# Patient Record
Sex: Female | Born: 1960 | Race: White | Hispanic: No | Marital: Single | Smoking: Former smoker
Health system: Southern US, Community
[De-identification: ages and names within clinical notes are randomized; demographics above are authoritative.]

## PROBLEM LIST (undated history)

## (undated) DIAGNOSIS — E669 Obesity, unspecified: Secondary | ICD-10-CM

## (undated) DIAGNOSIS — E785 Hyperlipidemia, unspecified: Secondary | ICD-10-CM

## (undated) DIAGNOSIS — R569 Unspecified convulsions: Secondary | ICD-10-CM

## (undated) DIAGNOSIS — G8929 Other chronic pain: Secondary | ICD-10-CM

## (undated) DIAGNOSIS — I2699 Other pulmonary embolism without acute cor pulmonale: Secondary | ICD-10-CM

## (undated) DIAGNOSIS — F32A Depression, unspecified: Secondary | ICD-10-CM

## (undated) DIAGNOSIS — I82409 Acute embolism and thrombosis of unspecified deep veins of unspecified lower extremity: Secondary | ICD-10-CM

## (undated) DIAGNOSIS — K922 Gastrointestinal hemorrhage, unspecified: Secondary | ICD-10-CM

## (undated) DIAGNOSIS — I639 Cerebral infarction, unspecified: Secondary | ICD-10-CM

## (undated) DIAGNOSIS — I10 Essential (primary) hypertension: Secondary | ICD-10-CM

## (undated) DIAGNOSIS — F419 Anxiety disorder, unspecified: Secondary | ICD-10-CM

## (undated) DIAGNOSIS — F329 Major depressive disorder, single episode, unspecified: Secondary | ICD-10-CM

## (undated) HISTORY — PX: CHOLECYSTECTOMY: SHX55

## (undated) HISTORY — PX: HIP ARTHROPLASTY: SHX981

## (undated) HISTORY — DX: Unspecified convulsions: R56.9

## (undated) HISTORY — PX: HERNIA REPAIR: SHX51

## (undated) HISTORY — PX: ANKLE SURGERY: SHX546

## (undated) HISTORY — PX: ATRIAL CARDIAC PACEMAKER INSERTION: SHX561

## (undated) HISTORY — PX: APPENDECTOMY: SHX54

## (undated) HISTORY — PX: KNEE SURGERY: SHX244

## (undated) HISTORY — PX: ABDOMINAL HYSTERECTOMY: SHX81

## (undated) HISTORY — PX: KIDNEY SURGERY: SHX687

---

## 2015-11-07 ENCOUNTER — Inpatient Hospital Stay
Admission: AD | Admit: 2015-11-07 | Payer: Self-pay | Source: Other Acute Inpatient Hospital | Admitting: Internal Medicine

## 2015-11-07 NOTE — Progress Notes (Addendum)
This is a no charge note  [facility], Thurmond ButtsWade.  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 Her abdominal pain started 4 days ago, associated with hematemesis. Initially she had small amount of blood in the vomitus, but had one large volume of hematemesis today. Hemoccult of the vomitus is positive. Hgb is 11.8 (not previous hemoglobin data available). Hemodynamically stable (blood pressure 109/79, heart rate 72), oxygen saturation 95%, temperature normal, electrolytes renal function okay. Chest x-rays negative. Patient does not have fever or chills. Pt is accepted to tele bed as inpt. Pleased call GI in AM. Since patient is hemodynamically stable and hemoglobin is 11.8, I did not ask EDP to call GI in night.  Jean HarpXilin Jhanvi Drakeford, MD  [facility] Pager [phone]  If 7PM-7AM, please contact night-coverage [email or web address] Password [email or web address] 11/07/2015, 2:37 AM

## 2015-11-17 ENCOUNTER — Other Ambulatory Visit: Payer: Self-pay

## 2015-11-17 ENCOUNTER — Emergency Department (HOSPITAL_COMMUNITY)
Admission: EM | Admit: 2015-11-17 | Discharge: 2015-11-17 | Disposition: A | Discharge status: Home / Self Care | Payer: Medicaid Other | Attending: Emergency Medicine | Admitting: Emergency Medicine

## 2015-11-17 ENCOUNTER — Emergency Department (HOSPITAL_BASED_OUTPATIENT_CLINIC_OR_DEPARTMENT_OTHER): Payer: Medicaid Other

## 2015-11-17 ENCOUNTER — Encounter (HOSPITAL_COMMUNITY): Payer: Self-pay | Admitting: *Deleted

## 2015-11-17 ENCOUNTER — Emergency Department (HOSPITAL_COMMUNITY): Payer: Medicaid Other

## 2015-11-17 DIAGNOSIS — Z8673 Personal history of transient ischemic attack (TIA), and cerebral infarction without residual deficits: Secondary | ICD-10-CM | POA: Diagnosis not present

## 2015-11-17 DIAGNOSIS — M7989 Other specified soft tissue disorders: Secondary | ICD-10-CM

## 2015-11-17 DIAGNOSIS — R1013 Epigastric pain: Secondary | ICD-10-CM

## 2015-11-17 DIAGNOSIS — R112 Nausea with vomiting, unspecified: Secondary | ICD-10-CM | POA: Diagnosis not present

## 2015-11-17 DIAGNOSIS — M79609 Pain in unspecified limb: Secondary | ICD-10-CM

## 2015-11-17 DIAGNOSIS — K920 Hematemesis: Secondary | ICD-10-CM

## 2015-11-17 DIAGNOSIS — Z8719 Personal history of other diseases of the digestive system: Secondary | ICD-10-CM

## 2015-11-17 DIAGNOSIS — E785 Hyperlipidemia, unspecified: Secondary | ICD-10-CM | POA: Diagnosis not present

## 2015-11-17 DIAGNOSIS — K644 Residual hemorrhoidal skin tags: Secondary | ICD-10-CM | POA: Diagnosis not present

## 2015-11-17 DIAGNOSIS — K219 Gastro-esophageal reflux disease without esophagitis: Secondary | ICD-10-CM | POA: Diagnosis not present

## 2015-11-17 DIAGNOSIS — M79604 Pain in right leg: Secondary | ICD-10-CM | POA: Insufficient documentation

## 2015-11-17 DIAGNOSIS — I10 Essential (primary) hypertension: Secondary | ICD-10-CM | POA: Diagnosis not present

## 2015-11-17 DIAGNOSIS — K2970 Gastritis, unspecified, without bleeding: Secondary | ICD-10-CM

## 2015-11-17 DIAGNOSIS — F329 Major depressive disorder, single episode, unspecified: Secondary | ICD-10-CM | POA: Diagnosis not present

## 2015-11-17 DIAGNOSIS — K921 Melena: Secondary | ICD-10-CM | POA: Insufficient documentation

## 2015-11-17 HISTORY — DX: Hyperlipidemia, unspecified: E78.5

## 2015-11-17 HISTORY — DX: Acute embolism and thrombosis of unspecified deep veins of unspecified lower extremity: I82.409

## 2015-11-17 HISTORY — DX: Anxiety disorder, unspecified: F41.9

## 2015-11-17 HISTORY — DX: Depression, unspecified: F32.A

## 2015-11-17 HISTORY — DX: Major depressive disorder, single episode, unspecified: F32.9

## 2015-11-17 HISTORY — DX: Essential (primary) hypertension: I10

## 2015-11-17 HISTORY — DX: Other pulmonary embolism without acute cor pulmonale: I26.99

## 2015-11-17 HISTORY — DX: Cerebral infarction, unspecified: I63.9

## 2015-11-17 HISTORY — DX: Obesity, unspecified: E66.9

## 2015-11-17 HISTORY — DX: Other chronic pain: G89.29

## 2015-11-17 LAB — COMPREHENSIVE METABOLIC PANEL
ALBUMIN: 3.9 g/dL (ref 3.5–5.0)
ALK PHOS: 111 U/L (ref 38–126)
ALT: 18 U/L (ref 14–54)
ANION GAP: 6 (ref 5–15)
AST: 16 U/L (ref 15–41)
BUN: 11 mg/dL (ref 6–20)
CHLORIDE: 104 mmol/L (ref 101–111)
CO2: 26 mmol/L (ref 22–32)
Calcium: 9.2 mg/dL (ref 8.9–10.3)
Creatinine, Ser: 0.94 mg/dL (ref 0.44–1.00)
GFR calc non Af Amer: >60 mL/min (ref >60)
GLUCOSE: 105 mg/dL — AB (ref 65–99)
POTASSIUM: 4.5 mmol/L (ref 3.5–5.1)
SODIUM: 136 mmol/L (ref 135–145)
Total Bilirubin: 0.5 mg/dL (ref 0.3–1.2)
Total Protein: 6.4 g/dL — ABNORMAL LOW (ref 6.5–8.1)

## 2015-11-17 LAB — I-STAT TROPONIN, ED: TROPONIN I, POC: 0.01 ng/mL (ref 0.00–0.08)

## 2015-11-17 LAB — URINALYSIS, ROUTINE W REFLEX MICROSCOPIC
BILIRUBIN URINE: NEGATIVE
Glucose, UA: NEGATIVE mg/dL
Hgb urine dipstick: NEGATIVE
KETONES UR: NEGATIVE mg/dL
LEUKOCYTES UA: NEGATIVE
NITRITE: NEGATIVE
PROTEIN: NEGATIVE mg/dL
Specific Gravity, Urine: 1.009 (ref 1.005–1.030)
pH: 8 (ref 5.0–8.0)

## 2015-11-17 LAB — LIPASE, BLOOD: Lipase: 13 U/L (ref 11–51)

## 2015-11-17 LAB — CBC WITH DIFFERENTIAL/PLATELET
BASOS PCT: 0 %
Basophils Absolute: 0 10*3/uL (ref 0.0–0.1)
EOS ABS: 0.1 10*3/uL (ref 0.0–0.7)
EOS PCT: 3 %
HCT: 36 % (ref 36.0–46.0)
HEMOGLOBIN: 11.8 g/dL — AB (ref 12.0–15.0)
Lymphocytes Relative: 31 %
Lymphs Abs: 1.4 10*3/uL (ref 0.7–4.0)
MCH: 28.7 pg (ref 26.0–34.0)
MCHC: 32.8 g/dL (ref 30.0–36.0)
MCV: 87.6 fL (ref 78.0–100.0)
MONOS PCT: 8 %
Monocytes Absolute: 0.3 10*3/uL (ref 0.1–1.0)
NEUTROS PCT: 58 %
Neutro Abs: 2.5 10*3/uL (ref 1.7–7.7)
PLATELETS: 194 10*3/uL (ref 150–400)
RBC: 4.11 MIL/uL (ref 3.87–5.11)
RDW: 13.9 % (ref 11.5–15.5)
WBC: 4.4 10*3/uL (ref 4.0–10.5)

## 2015-11-17 LAB — APTT: APTT: 29 s (ref 24–37)

## 2015-11-17 LAB — PROTIME-INR
INR: 1.04 (ref 0.00–1.49)
PROTHROMBIN TIME: 13.8 s (ref 11.6–15.2)

## 2015-11-17 LAB — POC OCCULT BLOOD, ED: Fecal Occult Bld: NEGATIVE

## 2015-11-17 MED ORDER — OMEPRAZOLE 20 MG PO CPDR: 20.0000 mg | DELAYED_RELEASE_CAPSULE | Freq: Every day | ORAL | Status: AC

## 2015-11-17 MED ORDER — RANITIDINE HCL 150 MG PO TABS: 150.0000 mg | ORAL_TABLET | Freq: Two times a day (BID) | ORAL | Status: AC

## 2015-11-17 MED ORDER — MORPHINE SULFATE (PF) 4 MG/ML IV SOLN
4.0000 mg | Freq: Once | INTRAVENOUS | Status: AC
  Administered 2015-11-17: 4 mg via INTRAVENOUS
  Filled 2015-11-17: qty 1

## 2015-11-17 MED ORDER — SODIUM CHLORIDE 0.9 % IV BOLUS (SEPSIS)
1000.0000 mL | Freq: Once | INTRAVENOUS | Status: AC
  Administered 2015-11-17: 1000 mL via INTRAVENOUS

## 2015-11-17 MED ORDER — FAMOTIDINE IN NACL 20-0.9 MG/50ML-% IV SOLN
20.0000 mg | Freq: Two times a day (BID) | INTRAVENOUS | Status: DC
  Administered 2015-11-17: 20 mg via INTRAVENOUS
  Filled 2015-11-17: qty 50

## 2015-11-17 MED ORDER — ONDANSETRON HCL 4 MG/2ML IJ SOLN
4.0000 mg | Freq: Once | INTRAMUSCULAR | Status: AC
  Administered 2015-11-17: 4 mg via INTRAVENOUS
  Filled 2015-11-17: qty 2

## 2015-11-17 MED ORDER — GI COCKTAIL ~~LOC~~
30.0000 mL | Freq: Once | ORAL | Status: AC
  Administered 2015-11-17: 30 mL via ORAL
  Filled 2015-11-17: qty 30

## 2015-11-17 NOTE — Discharge Instructions (Signed)
Your abdominal pain is likely from gastritis or an ulcer, possibly due to your hiatal hernia. You will need to take zantac and prilosec as directed, and avoid spicy/fatty/acidic foods, avoid soda/coffee/tea/alcohol. Avoid laying down flat within 30 minutes of eating. Avoid NSAIDs like ibuprofen/aleve/motrin/etc on an empty stomach. May consider using over the counter tums/maalox as needed for additional relief. Use home zofran as directed as needed for nausea. Use tylenol or home norco as needed for pain but don't drive or operate machinery while taking this medication. Follow up with your gastroenterologist at your already scheduled appointment in one week for ongoing evaluation of your abdominal pain. Return to the ER for changes or worsening symptoms.  Abdominal (belly) pain can be caused by many things. Your caregiver performed an examination and possibly ordered blood/urine tests and imaging (CT scan, x-rays, ultrasound). Many cases can be observed and treated at home after initial evaluation in the emergency department. Even though you are being discharged home, abdominal pain can be unpredictable. Therefore, you need a repeated exam if your pain does not resolve, returns, or worsens. Most patients with abdominal pain don't have to be admitted to the hospital or have surgery, but serious problems like appendicitis and gallbladder attacks can start out as nonspecific pain. Many abdominal conditions cannot be diagnosed in one visit, so follow-up evaluations are very important. SEEK IMMEDIATE MEDICAL ATTENTION IF YOU DEVELOP ANY OF THE FOLLOWING SYMPTOMS:  The pain does not go away or becomes severe.   A temperature above 101 develops.   Repeated vomiting occurs (multiple episodes).   The pain becomes localized to portions of the abdomen. The right side could possibly be appendicitis. In an adult, the left lower portion of the abdomen could be colitis or diverticulitis.   Blood is being passed in  stools or vomit (bright red or black tarry stools).   Return also if you develop chest pain, difficulty breathing, dizziness or fainting, or become confused, poorly responsive, or inconsolable (young children).  The constipation stays for more than 4 days.   There is belly (abdominal) or rectal pain.   You do not seem to be getting better.      Abdominal Pain, Adult Many things can cause belly (abdominal) pain. Most times, the belly pain is not dangerous. Many cases of belly pain can be watched and treated at home. HOME CARE   Do not take medicines that help you go poop (laxatives) unless told to by your doctor.  Only take medicine as told by your doctor.  Eat or drink as told by your doctor. Your doctor will tell you if you should be on a special diet. GET HELP IF:  You do not know what is causing your belly pain.  You have belly pain while you are sick to your stomach (nauseous) or have runny poop (diarrhea).  You have pain while you pee or poop.  Your belly pain wakes you up at night.  You have belly pain that gets worse or better when you eat.  You have belly pain that gets worse when you eat fatty foods.  You have a fever. GET HELP RIGHT AWAY IF:   The pain does not go away within 2 hours.  You keep throwing up (vomiting).  The pain changes and is only in the right or left part of the belly.  You have bloody or tarry looking poop. MAKE SURE YOU:   Understand these instructions.  Will watch your condition.  Will get help right  away if you are not doing well or get worse.   This information is not intended to replace advice given to you by your health care provider. Make sure you discuss any questions you have with your health care provider.   Document Released: 10/28/2007 Document Revised: 06/01/2014 Document Reviewed: 01/18/2013 Elsevier Interactive Patient Education 2016 [employer].  Food Choices for Gastroesophageal Reflux Disease, Adult When you  have gastroesophageal reflux disease (GERD), the foods you eat and your eating habits are very important. Choosing the right foods can help ease the discomfort of GERD. WHAT GENERAL GUIDELINES DO I NEED TO FOLLOW?  Choose fruits, vegetables, whole grains, low-fat dairy products, and low-fat meat, fish, and poultry.  Limit fats such as oils, salad dressings, butter, nuts, and avocado.  Keep a food diary to identify foods that cause symptoms.  Avoid foods that cause reflux. These may be different for different people.  Eat frequent small meals instead of three large meals each day.  Eat your meals slowly, in a relaxed setting.  Limit fried foods.  Cook foods using methods other than frying.  Avoid drinking alcohol.  Avoid drinking large amounts of liquids with your meals.  Avoid bending over or lying down until 2-3 hours after eating. WHAT FOODS ARE NOT RECOMMENDED? The following are some foods and drinks that may worsen your symptoms: Vegetables Tomatoes. Tomato juice. Tomato and spaghetti sauce. Chili peppers. Onion and garlic. Horseradish. Fruits Oranges, grapefruit, and lemon (fruit and juice). Meats High-fat meats, fish, and poultry. This includes hot dogs, ribs, ham, sausage, salami, and bacon. Dairy Whole milk and chocolate milk. Sour cream. Cream. Butter. Ice cream. Cream cheese.  Beverages Coffee and tea, with or without caffeine. Carbonated beverages or energy drinks. Condiments Hot sauce. Barbecue sauce.  Sweets/Desserts Chocolate and cocoa. Donuts. Peppermint and spearmint. Fats and Oils High-fat foods, including [place] fries and potato chips. Other Vinegar. Strong spices, such as black pepper, white pepper, red pepper, cayenne, curry powder, cloves, ginger, and chili powder. The items listed above may not be a complete list of foods and beverages to avoid. Contact your dietitian for more information.   This information is not intended to replace advice given  to you by your health care provider. Make sure you discuss any questions you have with your health care provider.   Document Released: 05/11/2005 Document Revised: 06/01/2014 Document Reviewed: 03/15/2013 Elsevier Interactive Patient Education 2016 [employer]  Gastritis, Adult Gastritis is soreness and puffiness (inflammation) of the lining of the stomach. If you do not get help, gastritis can cause bleeding and sores (ulcers) in the stomach. HOME CARE   Only take medicine as told by your doctor.  If you were given antibiotic medicines, take them as told. Finish the medicines even if you start to feel better.  Drink enough fluids to keep your pee (urine) clear or pale yellow.  Avoid foods and drinks that make your problems worse. Foods you may want to avoid include:  Caffeine or alcohol.  Chocolate.  Mint.  Garlic and onions.  Spicy foods.  Citrus fruits, including oranges, lemons, or limes.  Food containing tomatoes, including sauce, chili, salsa, and pizza.  Fried and fatty foods.  Eat small meals throughout the day instead of large meals. GET HELP RIGHT AWAY IF:   You have black or dark red poop (stools).  You throw up (vomit) blood. It may look like coffee grounds.  You cannot keep fluids down.  Your belly (abdominal) pain gets worse.  You  have a fever.  You do not feel better after 1 week.  You have any other questions or concerns. MAKE SURE YOU:   Understand these instructions.  Will watch your condition.  Will get help right away if you are not doing well or get worse.   This information is not intended to replace advice given to you by your health care provider. Make sure you discuss any questions you have with your health care provider.   Document Released: 10/28/2007 Document Revised: 08/03/2011 Document Reviewed: 06/24/2011 Elsevier Interactive Patient Education 2016 [employer]  Gastrointestinal Bleeding Gastrointestinal bleeding is  bleeding somewhere along the path that food travels through the body (digestive tract). This path is anywhere between the mouth and the opening of the butt (anus). You may have blood in your throw up (vomit) or in your poop (stools). If there is a lot of bleeding, you may need to stay in the hospital. HOME CARE  Only take medicine as told by your doctor.  Eat foods with fiber such as whole grains, fruits, and vegetables. You can also try eating 1 to 3 prunes a day.  Drink enough fluids to keep your pee (urine) clear or pale yellow. GET HELP RIGHT AWAY IF:   Your bleeding gets worse.  You feel dizzy, weak, or you pass out (faint).  You have bad cramps in your back or belly (abdomen).  You have large blood clumps (clots) in your poop.  Your problems are getting worse. MAKE SURE YOU:   Understand these instructions.  Will watch your condition.  Will get help right away if you are not doing well or get worse.   This information is not intended to replace advice given to you by your health care provider. Make sure you discuss any questions you have with your health care provider.   Document Released: 02/18/2008 Document Revised: 04/27/2012 Document Reviewed: 10/29/2014 Elsevier Interactive Patient Education 2016 [employer].  Hemorrhoids Hemorrhoids are puffy (swollen) veins around the rectum or anus. Hemorrhoids can cause pain, itching, bleeding, or irritation. HOME CARE  Eat foods with fiber, such as whole grains, beans, nuts, fruits, and vegetables. Ask your doctor about taking products with added fiber in them (fibersupplements).  Drink enough fluid to keep your pee (urine) clear or pale yellow.  Exercise often.  Go to the bathroom when you have the urge to poop. Do not wait.  Avoid straining to poop (bowel movement).  Keep the butt area dry and clean. Use wet toilet paper or moist paper towels.  Medicated creams and medicine inserted into the anus (anal  suppository) may be used or applied as told.  Only take medicine as told by your doctor.  Take a warm water bath (sitz bath) for 15-20 minutes to ease pain. Do this 3-4 times a day.  Place ice packs on the area if it is tender or puffy. Use the ice packs between the warm water baths.  Put ice in a plastic bag.  Place a towel between your skin and the bag.  Leave the ice on for 15-20 minutes, 03-04 times a day.  Do not use a donut-shaped pillow or sit on the toilet for a long time. GET HELP RIGHT AWAY IF:   You have more pain that is not controlled by treatment or medicine.  You have bleeding that will not stop.  You have trouble or are unable to poop (bowel movement).  You have pain or puffiness outside the area of the hemorrhoids. MAKE  SURE YOU:   Understand these instructions.  Will watch your condition.  Will get help right away if you are not doing well or get worse.   This information is not intended to replace advice given to you by your health care provider. Make sure you discuss any questions you have with your health care provider.   Document Released: 02/18/2008 Document Revised: 04/27/2012 Document Reviewed: 03/22/2012 Elsevier Interactive Patient Education 2016 Else[employer] Inc.  High-Fiber Diet Fiber, also called dietary fiber, is a type of carbohydrate found in fruits, vegetables, whole grains, and beans. A high-fiber diet can have many health benefits. Your health care provider may recommend a high-fiber diet to help:  Prevent constipation. Fiber can make your bowel movements more regular.  Lower your cholesterol.  Relieve hemorrhoids, uncomplicated diverticulosis, or irritable bowel syndrome.  Prevent overeating as part of a weight-loss plan.  Prevent heart disease, type 2 diabetes, and certain cancers. WHAT IS MY PLAN? The recommended daily intake of fiber includes:  38 grams for men under age 41.  30 grams for men over age 68.  25 grams for women  under age 45.  21 grams for women over age 58. You can get the recommended daily intake of dietary fiber by eating a variety of fruits, vegetables, grains, and beans. Your health care provider may also recommend a fiber supplement if it is not possible to get enough fiber through your diet. WHAT DO I NEED TO KNOW ABOUT A HIGH-FIBER DIET?  Fiber supplements have not been widely studied for their effectiveness, so it is better to get fiber through food sources.  Always check the fiber content on thenutrition facts label of any prepackaged food. Look for foods that contain at least 5 grams of fiber per serving.  Ask your dietitian if you have questions about specific foods that are related to your condition, especially if those foods are not listed in the following section.  Increase your daily fiber consumption gradually. Increasing your intake of dietary fiber too quickly may cause bloating, cramping, or gas.  Drink plenty of water. Water helps you to digest fiber. WHAT FOODS CAN I EAT? Grains Whole-grain breads. Multigrain cereal. Oats and oatmeal. Brown rice. Barley. Bulgur wheat. Millet. Bran muffins. Popcorn. Rye wafer crackers. Vegetables Sweet potatoes. Spinach. Kale. Artichokes. Cabbage. Broccoli. Green peas. Carrots. Squash. Fruits Berries. Pears. Apples. Oranges. Avocados. Prunes and raisins. Dried figs. Meats and Other Protein Sources [place], kidney, pinto, and soy beans. Split peas. Lentils. Nuts and seeds. Dairy Fiber-fortified yogurt. Beverages Fiber-fortified soy milk. Fiber-fortified orange juice. Other Fiber bars. The items listed above may not be a complete list of recommended foods or beverages. Contact your dietitian for more options. WHAT FOODS ARE NOT RECOMMENDED? Grains White bread. Pasta made with refined flour. White rice. Vegetables Fried potatoes. Canned vegetables. Well-cooked vegetables.  Fruits Fruit juice. Cooked, strained fruit. Meats and Other  Protein Sources Fatty cuts of meat. Fried Environmental education officer or fried fish. Dairy Milk. Yogurt. Cream cheese. Sour cream. Beverages Soft drinks. Other Cakes and pastries. Butter and oils. The items listed above may not be a complete list of foods and beverages to avoid. Contact your dietitian for more information. WHAT ARE SOME TIPS FOR INCLUDING HIGH-FIBER FOODS IN MY DIET?  Eat a wide variety of high-fiber foods.  Make sure that half of all grains consumed each day are whole grains.  Replace breads and cereals made from refined flour or white flour with whole-grain breads and cereals.  Replace white rice with  brown rice, bulgur wheat, or millet.  Start the day with a breakfast that is high in fiber, such as a cereal that contains at least 5 grams of fiber per serving.  Use beans in place of meat in soups, salads, or pasta.  Eat high-fiber snacks, such as berries, raw vegetables, nuts, or popcorn.   This information is not intended to replace advice given to you by your health care provider. Make sure you discuss any questions you have with your health care provider.   Document Released: 05/11/2005 Document Revised: 06/01/2014 Document Reviewed: 10/24/2013 Elsevier Interactive Patient Education 2016 [employer] Inc[employer]  Hiatal Hernia A hiatal hernia occurs when part of your stomach slides above the muscle that separates your abdomen from your chest (diaphragm). You can be born with a hiatal hernia (congenital), or it may develop over time. In almost all cases of hiatal hernia, only the top part of the stomach pushes through.  Many people have a hiatal hernia with no symptoms. The larger the hernia, the more likely that you will have symptoms. In some cases, a hiatal hernia allows stomach acid to flow back into the tube that carries food from your mouth to your stomach (esophagus). This may cause heartburn symptoms. Severe heartburn symptoms may mean you have developed a condition called  gastroesophageal reflux disease (GERD).  CAUSES  Hiatal hernias are caused by a weakness in the opening (hiatus) where your esophagus passes through your diaphragm to attach to the upper part of your stomach. You may be born with a weakness in your hiatus, or a weakness can develop. RISK FACTORS Older age is a major risk factor for a hiatal hernia. Anything that increases pressure on your diaphragm can also increase your risk of a hiatal hernia. This includes:  Pregnancy.  Excess weight.  Frequent constipation. SIGNS AND SYMPTOMS  People with a hiatal hernia often have no symptoms. If symptoms develop, they are almost always caused by GERD. They may include:  Heartburn.  Belching.  Indigestion.  Trouble swallowing.  Coughing or wheezing.  Sore throat.  Hoarseness.  Chest pain. DIAGNOSIS  A hiatal hernia is sometimes found during an exam for another problem. Your health care provider may suspect a hiatal hernia if you have symptoms of GERD. Tests may be done to diagnose GERD. These may include:  X-rays of your stomach or chest.  An upper gastrointestinal (GI) series. This is an X-ray exam of your GI tract involving the use of a chalky liquid that you swallow. The liquid shows up clearly on the X-ray.  Endoscopy. This is a procedure to look into your stomach using a thin, flexible tube that has a tiny camera and light on the end of it. TREATMENT  If you have no symptoms, you may not need treatment. If you have symptoms, treatment may include:  Dietary and lifestyle changes to help reduce GERD symptoms.  Medicines. These may include:  Over-the-counter antacids.  Medicines that make your stomach empty more quickly.  Medicines that block the production of stomach acid (H2 blockers).  Stronger medicines to reduce stomach acid (proton pump inhibitors).  You may need surgery to repair the hernia if other treatments are not helping. HOME CARE INSTRUCTIONS   Take all  medicines as directed by your health care provider.  Quit smoking, if you smoke.  Try to achieve and maintain a healthy body weight.  Eat frequent small meals instead of three large meals a day. This keeps your stomach from getting too full.  Eat slowly.  Do not lie down right after eating.  Do noteat 1-2 hours before bed.   Do not drink beverages with caffeine. These include cola, coffee, cocoa, and tea.  Do not drink alcohol.  Avoid foods that can make symptoms of GERD worse. These may include:  Fatty foods.  Citrus fruits.  Other foods and drinks that contain acid.  Avoid putting pressure on your belly. Anything that puts pressure on your belly increases the amount of acid that may be pushed up into your esophagus.   Avoid bending over, especially after eating.  Raise the head of your bed by putting blocks under the legs. This keeps your head and esophagus higher than your stomach.  Do not wear tight clothing around your chest or stomach.  Try not to strain when having a bowel movement, when urinating, or when lifting heavy objects. SEEK MEDICAL CARE IF:  Your symptoms are not controlled with medicines or lifestyle changes.  You are having trouble swallowing.  You have coughing or wheezing that will not go away. SEEK IMMEDIATE MEDICAL CARE IF:  Your pain is getting worse.  Your pain spreads to your arms, neck, jaw, teeth, or back.  You have shortness of breath.  You sweat for no reason.  You feel sick to your stomach (nauseous) or vomit.  You vomit blood.  You have bright red blood in your stools.  You have black, tarry stools.    This information is not intended to replace advice given to you by your health care provider. Make sure you discuss any questions you have with your health care provider.   Document Released: 08/01/2003 Document Revised: 06/01/2014 Document Reviewed: 04/28/2013 Elsevier Interactive Patient Education 2016 [employer]  Nausea and Vomiting Nausea is a sick feeling that often comes before throwing up (vomiting). Vomiting is a reflex where stomach contents come out of your mouth. Vomiting can cause severe loss of body fluids (dehydration). Children and elderly adults can become dehydrated quickly, especially if they also have diarrhea. Nausea and vomiting are symptoms of a condition or disease. It is important to find the cause of your symptoms. CAUSES   Direct irritation of the stomach lining. This irritation can result from increased acid production (gastroesophageal reflux disease), infection, food poisoning, taking certain medicines (such as nonsteroidal anti-inflammatory drugs), alcohol use, or tobacco use.  Signals from the brain.These signals could be caused by a headache, heat exposure, an inner ear disturbance, increased pressure in the brain from injury, infection, a tumor, or a concussion, pain, emotional stimulus, or metabolic problems.  An obstruction in the gastrointestinal tract (bowel obstruction).  Illnesses such as diabetes, hepatitis, gallbladder problems, appendicitis, kidney problems, cancer, sepsis, atypical symptoms of a heart attack, or eating disorders.  Medical treatments such as chemotherapy and radiation.  Receiving medicine that makes you sleep (general anesthetic) during surgery. DIAGNOSIS Your caregiver may ask for tests to be done if the problems do not improve after a few days. Tests may also be done if symptoms are severe or if the reason for the nausea and vomiting is not clear. Tests may include:  Urine tests.  Blood tests.  Stool tests.  Cultures (to look for evidence of infection).  X-rays or other imaging studies. Test results can help your caregiver make decisions about treatment or the need for additional tests. TREATMENT You need to stay well hydrated. Drink frequently but in small amounts.You may wish to drink water, sports drinks, clear broth, or eat  frozen  ice pops or gelatin dessert to help stay hydrated.When you eat, eating slowly may help prevent nausea.There are also some antinausea medicines that may help prevent nausea. HOME CARE INSTRUCTIONS   Take all medicine as directed by your caregiver.  If you do not have an appetite, do not force yourself to eat. However, you must continue to drink fluids.  If you have an appetite, eat a normal diet unless your caregiver tells you differently.  Eat a variety of complex carbohydrates (rice, wheat, potatoes, bread), lean meats, yogurt, fruits, and vegetables.  Avoid high-fat foods because they are more difficult to digest.  Drink enough water and fluids to keep your urine clear or pale yellow.  If you are dehydrated, ask your caregiver for specific rehydration instructions. Signs of dehydration may include:  Severe thirst.  Dry lips and mouth.  Dizziness.  Dark urine.  Decreasing urine frequency and amount.  Confusion.  Rapid breathing or pulse. SEEK IMMEDIATE MEDICAL CARE IF:   You have blood or brown flecks (like coffee grounds) in your vomit.  You have black or bloody stools.  You have a severe headache or stiff neck.  You are confused.  You have severe abdominal pain.  You have chest pain or trouble breathing.  You do not urinate at least once every 8 hours.  You develop cold or clammy skin.  You continue to vomit for longer than 24 to 48 hours.  You have a fever. MAKE SURE YOU:   Understand these instructions.  Will watch your condition.  Will get help right away if you are not doing well or get worse.   This information is not intended to replace advice given to you by your health care provider. Make sure you discuss any questions you have with your health care provider.   Document Released: 05/11/2005 Document Revised: 08/03/2011 Document Reviewed: 10/08/2010 Elsevier Interactive Patient Education [employer]

## 2015-11-17 NOTE — ED Provider Notes (Signed)
CSN: [id number]     Arrival date & time 11/17/15  4098 History   First MD Initiated Contact with Patient 11/17/15 1010     Chief Complaint  Patient presents with  . Abdominal Pain  . Leg Pain     (Consider location/radiation/quality/duration/timing/severity/associated sxs/prior Treatment) HPI Comments: Jean Castillo is a 55 y.o. 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 She was seen at [facility] on 11/07/15 with guaiac positive hematemesis, no GI doctor on call there so she was supposed to be transferred to [facility] but no beds were available so she was never transferred, was discharged after overnight admission at [facility]. She states that since then she has had ongoing symptoms. She currently describes her epigastric abdominal pain is 9/10 constant burning nonradiating pain worse with eating, unrelieved with Norco, and somewhat relieved with Mylanta, Pepcid, and Zantac. Associated symptoms include nausea and vomiting with 3 episodes of bloody emesis last night, bright red blood per rectum, and some slight rectal pain. She has no known history of hemorrhoids. She states that when she was sent home for [facility], they told her to stop her Xarelto because of the GI bleed. She states that since stopping her anticoagulation, she's developed some R calf pain.   She denies any fevers, chills, chest pain, shortness breath, diarrhea, constipation, obstipation, melena, dysuria, hematuria, vaginal bleeding or discharge, numbness, tingling, weakness, or leg swelling. She denies any sick contacts, suspicious food intake, recent travel, alcohol use, or chronic NSAID use.  Of note, states that this entire series of events started when she was having a "gastric motility"  procedure done on 10/15/15 at [facility] in order to evaluate her condition prior to her hiatal hernia repair, and during the procedure her BP spiked and she had a stroke. At that time, she was seen in the ER and then ended up at [facility] for stroke rehab. She is scheduled to see [facility] next week.  Patient is a 55 y.o. female presenting with abdominal pain and leg pain. The history is provided by the patient and medical records. No language interpreter was used.  Abdominal Pain Pain location:  Epigastric Pain quality: burning   Pain radiates to:  Does not radiate Pain severity:  Moderate Onset quality:  Gradual Duration:  1 week Timing:  Constant Progression:  Waxing and waning Chronicity:  New Context: not recent travel, not sick contacts and not suspicious food intake   Relieved by:  Antacids and OTC medications Worsened by:  Eating Ineffective treatments: norco. Associated symptoms: hematemesis, hematochezia, nausea and vomiting   Associated symptoms: no chest pain, no chills, no constipation, no diarrhea, no dysuria, no fever, no flatus, no hematuria, no melena, no shortness of breath, no vaginal bleeding and no vaginal discharge   Risk factors: multiple surgeries   Risk factors: no alcohol abuse and no NSAID use   Leg Pain Associated symptoms: no fever     Past Medical History  Diagnosis Date  . Hyperlipidemia   . Obesity   . Stroke (HCC)   . Anxiety   . Chronic pain   . Hypertension   . PE (pulmonary embolism)   . DVT (deep venous thrombosis) (HCC)   . Depression    History reviewed. No pertinent past surgical history. History  reviewed. No pertinent family history. Social History  Substance Use Topics  . Smoking status: Never Smoker   . Smokeless tobacco: None  . Alcohol Use: No   OB History    No data available     Review of Systems  Constitutional: Negative for fever and chills.  Respiratory: Negative for shortness of breath.     Cardiovascular: Negative for chest pain and leg swelling.  Gastrointestinal: Positive for nausea, vomiting, abdominal pain, hematochezia, rectal pain and hematemesis. Negative for diarrhea, constipation, blood in stool (no melena), melena and flatus. Anal bleeding: +hematochezia.  Genitourinary: Negative for dysuria, hematuria, vaginal bleeding and vaginal discharge.  Musculoskeletal: Positive for myalgias (R calf). Negative for arthralgias.  Skin: Negative for color change.  Allergic/Immunologic: Negative for immunocompromised state.  Neurological: Negative for weakness and numbness.  Hematological: Does not bruise/bleed easily.  Psychiatric/Behavioral: Negative for confusion.   10 Systems reviewed and are negative for acute change except as noted in the HPI.    Allergies  Bee venom; Nsaids; and Phenergan  Home Medications   Prior to Admission medications   Not on File   BP 122/79 mmHg  Pulse 67  Temp(Src) 98 F (36.7 C) (Oral)  Resp 20  SpO2 96% Physical Exam  Constitutional: She is oriented to person, place, and time. Vital signs are normal. She appears well-developed and well-nourished.  Non-toxic appearance. No distress.  Afebrile, nontoxic, NAD  HENT:  Head: Normocephalic and atraumatic.  Mouth/Throat: Oropharynx is clear and moist and mucous membranes are normal.  Eyes: Conjunctivae and EOM are normal. Right eye exhibits no discharge. Left eye exhibits no discharge.  Neck: Normal range of motion. Neck supple.  Cardiovascular: Normal rate, regular rhythm, normal heart sounds and intact distal pulses.  Exam reveals no gallop and no friction rub.   No murmur heard. Pulmonary/Chest: Effort normal and breath sounds normal. No respiratory distress. She has no decreased breath sounds. She has no wheezes. She has no rhonchi. She has no rales.  Abdominal: Soft. Normal appearance and bowel sounds are normal. She exhibits no distension. There is tenderness in the epigastric area.  There is no rigidity, no rebound, no guarding, no CVA tenderness, no tenderness at McBurney's point and negative Murphy's sign.    Soft, nondistended, +BS throughout, with mild  epigastric TTP, no r/g/r, neg murphy's, neg mcburney's, no CVA TTP   Genitourinary: Rectal exam shows external hemorrhoid and internal hemorrhoid. Rectal exam shows no fissure, no mass, no tenderness and anal tone normal. Guaiac negative stool.  Chaperone present No gross blood noted on rectal exam, normal tone, no tenderness, no mass or fissure, with old ext hemorrhoidal skin tags and palpable int hemorrhoids. FOBT neg  Musculoskeletal: Normal range of motion.       Right lower leg: She exhibits tenderness. She exhibits no bony tenderness, no swelling and no edema.       Legs: MAE x4 Strength and sensation grossly intact at baseline Distal pulses intact R calf with mild TTP, no focal bony TTP, with no skin changes, no warmth or swelling. No pedal edema, +homan's to R side  Neurological: She is alert and oriented to person, place, and time. She has normal strength. No sensory deficit.  Skin: Skin is warm, dry and intact. No rash noted.  Psychiatric: She has a normal mood and affect.  Nursing note and vitals reviewed.   ED Course  Procedures (including critical care time) Labs Review Labs Reviewed  CBC WITH DIFFERENTIAL/PLATELET - Abnormal; Notable for the  following:    Hemoglobin 11.8 (*)    All other components within normal limits  COMPREHENSIVE METABOLIC PANEL - Abnormal; Notable for the following:    Glucose, Bld 105 (*)    Total Protein 6.4 (*)    All other components within normal limits  LIPASE, BLOOD  PROTIME-INR  APTT  URINALYSIS, ROUTINE W REFLEX MICROSCOPIC (NOT AT [facility])  POC OCCULT BLOOD, ED  Rosezena SensorI-STAT TROPOININ, ED    Imaging Review Dg Abd Acute W/chest  11/17/2015  CLINICAL DATA:  Hematemesis a couple weeks ago, recurred today, epigastric and mid sternal chest pain with burning sensation,  slight shortness of breath, LEFT anterior abdominal pain and aching yesterday and today, hypertension, hiatal hernia, prior cardiac and renal surgery, pulmonary emboli, former smoker EXAM: DG ABDOMEN ACUTE W/ 1V CHEST COMPARISON:  None FINDINGS: LEFT subclavian transvenous pacemaker leads project over RIGHT atrium and RIGHT ventricle. Enlargement of cardiac silhouette. Mediastinal contours and pulmonary vascularity normal. Lungs clear. No pleural effusion or pneumothorax. Surgical clips RIGHT upper quadrant question cholecystectomy. Additional calcifications RIGHT lower quadrant. Dense calcification versus radiopaque foreign body projects over LEFT upper quadrant. Nonobstructive bowel gas pattern. No bowel dilatation, bowel wall thickening or free intraperitoneal air. Bones demineralized. No urinary tract calcification. IMPRESSION: Enlargement of cardiac silhouette post pacemaker. No acute abdominal findings. Electronically Signed   By: Ulyses SouthwardMark  Boles M.D.   On: 11/17/2015 11:41     Progress Notes by Jenetta Logesami Wood, RVT at 11/17/2015 12:09 PM    Author: Jenetta Logesami Wood, RVT Service: Vascular Lab Author Type: Cardiovascular Sonographer   Filed: 11/17/2015 12:10 PM Note Time: 11/17/2015 12:09 PM Status: Signed   Editor: Jenetta Logesami Wood, RVT (Cardiovascular Sonographer)     Expand All Collapse All   VASCULAR LAB PRELIMINARY PRELIMINARY PRELIMINARY PRELIMINARY  Right lower extremity venous duplex has been completed.   Right: No evidence of DVT, superficial thrombosis, or Baker's cyst.   Jenetta Logesami Wood, RVT, RDMS 11/17/2015, 12:09 PM       CT ABDOMEN PELVIS W CONTRAST 10/22/2015 [facility]  Result Impression  No acute abnormality as discussed.   Result Narrative   CT of the abdomen and pelvis with contrast  INDICATION: Abdominal pain  Following intravenous contrast administration images were acquired from the level of the diaphragm to the level of the pubic symphysis.   For all [facility] exams, one or more of the following radiation dose reduction techniques were used to achieve the minimum dose exposure while maintaining necessary diagnostic image quality: automated exposure control, BMI based dose modulation, and iterative reconstruction technique.  CTDIvol: 25.1 mGy. DLP: 1341 mGy-cm.  Comparison is made with a prior CT of June 22, 2015.  Surgical clips are seen from prior cholecystectomy. The liver, spleen, pancreas, adrenal glands, kidneys and abdominal aorta are unremarkable. The patient is status post hysterectomy and probable appendectomy. No bowel obstruction, mucosal thickening or free fluid is evident. The urinary bladder is unremarkable. The patient has undergone prior right hip arthroplasty. No acute skeletal abnormality is seen.    I have personally reviewed and evaluated these images and lab results as part of my medical decision-making.   EKG Interpretation   Date/Time:  Sunday November 17 2015 10:15:38 EDT Ventricular Rate:  71 PR Interval:    QRS Duration: 95 QT Interval:  404 QTC Calculation: 439 R Axis:   45 Text Interpretation:  Sinus rhythm Borderline short PR interval Low  voltage, precordial leads Abnormal R-wave progression, early transition  Nonspecific T abnormalities,  anterior leads Abnormal ekg No previous  tracing Confirmed by BEATON  MD, ROBERT ([id number]) on 11/17/2015 12:19:24 PM      MDM   Final diagnoses:  Epigastric abdominal pain  Nausea and vomiting in adult patient  Hematemesis with nausea  Hematochezia  Residual hemorrhoidal skin tags  Right leg pain  Gastroesophageal reflux disease, esophagitis presence not specified  H/O hiatal hernia  Gastritis    55 y.o. female here with epigastric abd pain, hematemesis, BRBPR hematochezia, n/v, some mild rectal pain, ongoing since last week; [facility] but no beds were available so she was observed overnight and then discharged  in the morning. Taken off anticoagulation prior to d/c last week. Symptoms have been ongoing since then, worsening last night. On exam, epigastric TTP, nonperitoneal; some mild R calf TTP without swelling. NVI with soft compartments. Rectal reveals old hemorrhoidal skin tags, no gross blood. Will get labs, EKG, trop, Acute abd series, R leg DVT U/S, coags, U/A, and give fluids, morphine, zofran, and GI cocktail. Will likely need admission for GI consult. Discussed case with my attending Dr. Radford PaxBeaton who agrees with plan. Will reassess shortly  1:25 PM Trop neg. FOBT neg. U/A clear. CBC w/diff showing Hgb 11.8 which is what it was last week, stable without acute findings. CMP WNL. Lipase WNL. Coags WNL. Acute abd series unremarkable. EKG without acute ischemic findings. DVT study neg. Nausea improved, tolerated PO well here with GI cocktail, but pain still uncomfortable although slightly improved with morphine 4mg . Will give another 4mg  morphine, then likely d/c home with prilosec and zantac scheduled, tums/maalox PRN, and home zofran/norco as directed. Discussed smaller more frequent meals to help lessen burden on hiatal hernia, and avoidance of spicy/fatty/fried foods/etc. F/up with GI next week. Discussed that unless we did NGT to prove she had hematemesis, currently she doesn't have indication for admission-- she declines wanting NGT done today, had that last week and doesn't want it again. I feel that with a stable H/H, no ongoing n/v, and tolerating PO well, she can safely be discharged home to f/up outpatient. I explained the diagnosis and have given explicit precautions to return to the ER including for any other new or worsening symptoms. The patient understands and accepts the medical plan as it's been dictated and I have answered their questions. Discharge instructions concerning home care and prescriptions have been given. The patient is STABLE and is discharged to home in good condition.  BP 113/69  mmHg  Pulse 71  Temp(Src) 98 F (36.7 C) (Oral)  Resp 15  SpO2 97%  Meds ordered this encounter  Medications  . gi cocktail (Maalox,Lidocaine,Donnatal)    Sig:   . ondansetron (ZOFRAN) injection 4 mg    Sig:   . sodium chloride 0.9 % bolus 1,000 mL    Sig:   . morphine 4 MG/ML injection 4 mg    Sig:   . famotidine (PEPCID) IVPB 20 mg premix    Sig:   . morphine 4 MG/ML injection 4 mg    Sig:   . ranitidine (ZANTAC) 150 MG tablet    Sig: Take 1 tablet (150 mg total) by mouth 2 (two) times daily.    Dispense:  60 tablet    Refill:  0    Order Specific Question:  Supervising Provider    Answer:  MILLER, BRIAN [[id number]]  . omeprazole (PRILOSEC) 20 MG capsule    Sig: Take 1 capsule (20 mg total) by  mouth daily.    Dispense:  5 capsule    Refill:  0    Order Specific Question:  Supervising Provider    Answer:  Eber Hong [[id number]]       Ytzel Gubler Camprubi-Soms, PA-C 11/17/15 1339  Nelva Nay, MD 11/21/15 [id number]

## 2015-11-17 NOTE — ED Notes (Signed)
Pt arrived by ems for upper abd pain. Hx of same and was seen at [facility] recently for hiatal hernia and dc home. Pt had vomited blood at that time and had been taken off blood thinners. Has hx of dvt and pe, now has return of abd pain and now has right leg pain.

## 2015-11-17 NOTE — ED Notes (Signed)
[facility] contacted to transport [facility] back [facility]

## 2015-11-17 NOTE — ED Notes (Addendum)
[facility] , has DVT  In both legs and PE was placed on blood thinners was taken off when she had GI bleed. Started to vomit blood Friday night and twice yesterday and then last was 1 am today pt c/o of lower chest pain and upper abd pain and rt leg pain

## 2015-11-17 NOTE — ED Notes (Signed)
ptar here 

## 2015-11-17 NOTE — ED Notes (Signed)
Pt has hx of stroke 4 weeks ago, stays at [facility] for re[facility]. Has left leg weakness as residual.

## 2015-11-17 NOTE — ED Notes (Signed)
[facility] called to take [facility]

## 2015-11-17 NOTE — Progress Notes (Signed)
VASCULAR LAB PRELIMINARY  PRELIMINARY  PRELIMINARY  PRELIMINARY  Right lower extremity venous duplex  has been completed.    Right:  No evidence of DVT, superficial thrombosis, or Baker's cyst.   Jenetta Logesami Michi Herrmann, RVT, RDMS 11/17/2015, 12:09 PM

## 2015-11-17 NOTE — ED Notes (Signed)
To US and xray

## 2015-12-02 ENCOUNTER — Encounter (HOSPITAL_COMMUNITY): Payer: Self-pay | Admitting: Emergency Medicine

## 2015-12-02 ENCOUNTER — Emergency Department (HOSPITAL_COMMUNITY)
Admission: EM | Admit: 2015-12-02 | Discharge: 2015-12-02 | Disposition: A | Discharge status: Home / Self Care | Payer: Medicaid Other | Attending: Emergency Medicine | Admitting: Emergency Medicine

## 2015-12-02 DIAGNOSIS — F329 Major depressive disorder, single episode, unspecified: Secondary | ICD-10-CM | POA: Insufficient documentation

## 2015-12-02 DIAGNOSIS — Z79899 Other long term (current) drug therapy: Secondary | ICD-10-CM | POA: Diagnosis not present

## 2015-12-02 DIAGNOSIS — I10 Essential (primary) hypertension: Secondary | ICD-10-CM | POA: Insufficient documentation

## 2015-12-02 DIAGNOSIS — E785 Hyperlipidemia, unspecified: Secondary | ICD-10-CM | POA: Insufficient documentation

## 2015-12-02 DIAGNOSIS — Z6841 Body Mass Index (BMI) 40.0 and over, adult: Secondary | ICD-10-CM | POA: Insufficient documentation

## 2015-12-02 DIAGNOSIS — R569 Unspecified convulsions: Secondary | ICD-10-CM

## 2015-12-02 DIAGNOSIS — R109 Unspecified abdominal pain: Secondary | ICD-10-CM | POA: Insufficient documentation

## 2015-12-02 DIAGNOSIS — E669 Obesity, unspecified: Secondary | ICD-10-CM | POA: Diagnosis not present

## 2015-12-02 DIAGNOSIS — Z8673 Personal history of transient ischemic attack (TIA), and cerebral infarction without residual deficits: Secondary | ICD-10-CM | POA: Diagnosis not present

## 2015-12-02 HISTORY — DX: Gastrointestinal hemorrhage, unspecified: K92.2

## 2015-12-02 MED ORDER — ACETAMINOPHEN 325 MG PO TABS
650.0000 mg | ORAL_TABLET | Freq: Once | ORAL | Status: AC
  Administered 2015-12-02: 650 mg via ORAL
  Filled 2015-12-02: qty 2

## 2015-12-02 MED ORDER — FENTANYL CITRATE (PF) 100 MCG/2ML IJ SOLN
50.0000 ug | Freq: Once | INTRAMUSCULAR | Status: AC
  Administered 2015-12-02: 50 ug via INTRAVENOUS
  Filled 2015-12-02: qty 2

## 2015-12-02 MED ORDER — LEVETIRACETAM IN NACL 1000 MG/100ML IV SOLN
INTRAVENOUS | Status: AC
  Filled 2015-12-02: qty 100

## 2015-12-02 MED ORDER — LEVETIRACETAM IN NACL 500 MG/100ML IV SOLN
500.0000 mg | Freq: Once | INTRAVENOUS | Status: AC
  Administered 2015-12-02: 500 mg via INTRAVENOUS
  Filled 2015-12-02: qty 100

## 2015-12-02 NOTE — Discharge Instructions (Signed)
PLEASE REFER TO NEUROLOGY AT [facility] AS SHE MAY REQUIRE OVERNIGHT EEG MONITORING FOR HER SEIZURES   Seizure, Adult A seizure is abnormal electrical activity in the brain. Seizures usually last from 30 seconds to 2 minutes. There are various types of seizures. Before a seizure, you may have a warning sensation (aura) that a seizure is about to occur. An aura may include the following symptoms:   Fear or anxiety.  Nausea.  Feeling like the room is spinning (vertigo).  Vision changes, such as seeing flashing lights or spots. Common symptoms during a seizure include:  A change in attention or behavior (altered mental status).  Convulsions with rhythmic jerking movements.  Drooling.  Rapid eye movements.  Grunting.  Loss of bladder and bowel control.  Bitter taste in the mouth.  Tongue biting. After a seizure, you may feel confused and sleepy. You may also have an injury resulting from convulsions during the seizure. HOME CARE INSTRUCTIONS   If you are given medicines, take them exactly as prescribed by your health care provider.  Keep all follow-up appointments as directed by your health care provider.  Do not swim or drive or engage in risky activity during which a seizure could cause further injury to you or others until your health care provider says it is OK.  Get adequate rest.  Teach friends and family what to do if you have a seizure. They should:  Lay you on the ground to prevent a fall.  Put a cushion under your head.  Loosen any tight clothing around your neck.  Turn you on your side. If vomiting occurs, this helps keep your airway clear.  Stay with you until you recover.  Know whether or not you need emergency care. SEEK IMMEDIATE MEDICAL CARE IF:  The seizure lasts longer than 5 minutes.  The seizure is severe or you do not wake up immediately after the seizure.  You have an altered mental status after the seizure.  You are having  more frequent or worsening seizures. Someone should drive you to the emergency department or call local emergency services (911 in [place] MAKE SURE YOU:  Understand these instructions.  Will watch your condition.  Will get help right away if you are not doing well or get worse.   This information is not intended to replace advice given to you by your health care provider. Make sure you discuss any questions you have with your health care provider.   Document Released: 05/08/2000 Document Revised: 06/01/2014 Document Reviewed: 12/21/2012 Elsevier Interactive Patient Education [employer]2016 [employer] Inc[employer]

## 2015-12-02 NOTE — ED Notes (Signed)
Per EMS: Pt d/c'd today from [facility] with seizures and was en route back to [facility] and had 2 witnessed seizures lasting 1-2 minutes each by EMS.  Pt has been given 2mg  ativan per EMS and is now answering questions appropriately, no tongue injury, no incontinence.  117 systolic, 20 rr, 56%99% on 10L NRB, cbg 98

## 2015-12-02 NOTE — ED Notes (Signed)
[facility]

## 2015-12-02 NOTE — ED Provider Notes (Signed)
CSN: [id number]     Arrival date & time 12/02/15  1437 History   First MD Initiated Contact with Patient 12/02/15 1527     Chief Complaint  Patient presents with  . Seizures    Patient is a 55 y.o. female presenting with seizures. The history is provided by the patient.  Seizures Seizure activity on arrival: no   Postictal symptoms: confusion   Return to baseline: yes   Severity:  Moderate Timing:  Once Patient presents with seizures Apparently , she was just discharged from [facility] for seizures On the way back to nursing home, she had a seizure en route She was given ativan and sent to this facility Improved with ativan Pt is back to baseline No tongue biting No incontinence No fever She has mild chest/abdominal discomfort from hiatal hernia No other complaints currently  Past Medical History  Diagnosis Date  . Hyperlipidemia   . Obesity   . Stroke (HCC)   . Anxiety   . Chronic pain   . Hypertension   . PE (pulmonary embolism)   . DVT (deep venous thrombosis) (HCC)   . Depression   . GI bleed    Past Surgical History  Procedure Laterality Date  . Abdominal hysterectomy    . Hernia repair    . Hip arthroplasty    . Kidney surgery    . Appendectomy    . Cholecystectomy    . Ankle surgery    . Knee surgery     History reviewed. No pertinent family history. Social History  Substance Use Topics  . Smoking status: Never Smoker   . Smokeless tobacco: None  . Alcohol Use: No   OB History    No data available     Review of Systems  Constitutional: Negative for fever.  Cardiovascular:       Chest pain from hiatal hernia   Gastrointestinal: Positive for abdominal pain.  Neurological: Positive for seizures and weakness.       Chronic weakness in left LE   Psychiatric/Behavioral: The patient is nervous/anxious.   All other systems reviewed and are negative.     Allergies  Bee venom; Nsaids; Phenergan; and Toradol  Home Medications   Prior  to Admission medications   Medication Sig Start Date End Date Taking? Authorizing Provider  atorvastatin (LIPITOR) 10 MG tablet Take 10 mg by mouth daily.    Historical Provider, MD  clonazePAM (KLONOPIN) 0.5 MG tablet Take 0.5 mg by mouth 2 (two) times daily as needed for anxiety.    Historical Provider, MD  docusate sodium (COLACE) 100 MG capsule Take 100 mg by mouth 2 (two) times daily.    Historical Provider, MD  enoxaparin (LOVENOX) 40 MG/0.4ML injection Inject 40 mg into the skin daily.    Historical Provider, MD  FLUoxetine (PROZAC) 20 MG tablet Take 60 mg by mouth daily.    Historical Provider, MD  gabapentin (NEURONTIN) 400 MG capsule Take 400 mg by mouth at bedtime.    Historical Provider, MD  HYDROcodone-acetaminophen (NORCO/VICODIN) 5-325 MG tablet Take 1 tablet by mouth every 6 (six) hours as needed for moderate pain.     Historical Provider, MD  omeprazole (PRILOSEC) 20 MG capsule Take 1 capsule (20 mg total) by mouth daily. 11/17/15   Mercedes Camprubi-Soms, PA-C  ondansetron (ZOFRAN) 4 MG tablet Take 4 mg by mouth every 8 (eight) hours as needed for nausea or vomiting.    Historical Provider, MD  oxybutynin (DITROPAN) 5 MG tablet Take  5 mg by mouth 2 (two) times daily.    Historical Provider, MD  polyethylene glycol (MIRALAX / GLYCOLAX) packet Take 17 g by mouth daily as needed for moderate constipation.    Historical Provider, MD  propranolol (INDERAL) 20 MG tablet Take 20 mg by mouth daily.    Historical Provider, MD  ramelteon (ROZEREM) 8 MG tablet Take 8 mg by mouth at bedtime.    Historical Provider, MD  ranitidine (ZANTAC) 150 MG tablet Take 1 tablet (150 mg total) by mouth 2 (two) times daily. 11/17/15   Mercedes Camprubi-Soms, PA-C  ranitidine (ZANTAC) 300 MG tablet Take 300 mg by mouth at bedtime.    Historical Provider, MD   BP 133/77 mmHg  Pulse 79  Temp(Src) 98.1 F (36.7 C) (Oral)  Resp 19  Ht 5\' 3"  (1.6 m)  Wt 113.399 kg  BMI 44.30 kg/m2  SpO2 97% Physical  Exam CONSTITUTIONAL: chronically ill appearing HEAD: Normocephalic/atraumatic EYES: EOMI/PERRL ENMT: Mucous membranes moist, no evidence of facial trauma NECK: supple no meningeal signs CV: S1/S2 noted, no murmurs/rubs/gallops noted LUNGS: Lungs are clear to auscultation bilaterally, no apparent distress ABDOMEN: soft, nontender NEURO: Pt is awake/alert/appropriate,  No facial droop.  She has weakness in left LE (chronic).  She can move right LE EXTREMITIES: pulses normal/equal, full ROM, no deformities noted SKIN: warm, color normal PSYCH: anxious and tearful  ED Course  Procedures  Medications  acetaminophen (TYLENOL) tablet 650 mg (650 mg Oral Given 12/02/15 1611)  fentaNYL (SUBLIMAZE) injection 50 mcg (50 mcg Intravenous Given 12/02/15 1611)  levETIRAcetam (KEPPRA) IVPB 500 mg/100 mL premix (500 mg Intravenous Given 12/02/15 1633)      EKG Interpretation   Date/Time:  Monday December 02 2015 14:48:22 EDT Ventricular Rate:  83 PR Interval:    QRS Duration: 97 QT Interval:  377 QTC Calculation: 443 R Axis:   49 Text Interpretation:  Sinus rhythm Low voltage, precordial leads Abnormal  R-wave progression, early transition Nonspecific T abnormalities, anterior  leads No significant change since last tracing Confirmed by Bebe ShaggyWICKLINE  MD,  Dorinda HillNALD ([id number]) on 12/02/2015 3:50:40 PM     3:53 PM  Pt with complex medical history including recent stroke and is [facility] She has had increasing seizures recently, was supposed to be on dilantin but there is concern she was not actually receiving this medication.  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

## 2015-12-02 NOTE — ED Provider Notes (Signed)
At time of discharge, pt resting comfortably She reports mild HA but no other complaints We did give dose of keppra here On further review of d/c paperwork, her keppra/topamax was stopped at [facility] (it was listed on admission but not discharge) I offered to restart the keppra until she has definitive evaluation of her seizures She refused and became upset.   I advised her that her workup can continue as outpatient as currently she is stable/awake/alert, back to baseline She told me she thinks "you don't want the blood on your hands" and she is now demanding discharge She refuses to take any keppra prescription from me   Jean Rhineonald Christia Coaxum, MD 12/02/15 [id number]

## 2015-12-02 NOTE — ED Notes (Signed)
Pt requesting to speak to social worker for "personal reasons".  Pt would not go in to detail about the reason why.  Dr. Bebe ShaggyWickline notified.

## 2015-12-02 NOTE — ED Notes (Signed)
Pt made aware to return if symptoms worsen or if any life threatening symptoms occur.   

## 2016-01-08 ENCOUNTER — Other Ambulatory Visit: Payer: Self-pay | Admitting: Gastroenterology

## 2016-01-08 DIAGNOSIS — K50919 Crohn's disease, unspecified, with unspecified complications: Secondary | ICD-10-CM

## 2016-01-15 ENCOUNTER — Encounter: Payer: Self-pay | Admitting: Neurology

## 2016-01-15 ENCOUNTER — Ambulatory Visit (INDEPENDENT_AMBULATORY_CARE_PROVIDER_SITE_OTHER): Payer: Medicaid Other | Admitting: Neurology

## 2016-01-15 VITALS — BP 124/78 | HR 140 | Temp 98.3°F | Ht 63.0 in | Wt 242.4 lb

## 2016-01-15 DIAGNOSIS — R569 Unspecified convulsions: Secondary | ICD-10-CM | POA: Diagnosis not present

## 2016-01-15 NOTE — Progress Notes (Signed)
NEUROLOGY CONSULTATION NOTE  Jean Castillo MRN: [id number] DOB: Feb 01, 1961  Referring provider: Dr. Colon BranchAyyaz Qureshi  Primary care provider: Dr. Colon BranchAyyaz Qureshi  Reason for consult:  seizures  Dear Dr Virgina OrganQureshi:  Thank you for your kind referral of Jean Castillo for consultation of the above symptoms. Although her history is well known to you, please allow me to reiterate it for the purpose of our medical record. The patient was accompanied to the clinic by SNF staff who also provides collateral information. Records and images were personally reviewed where available.  HISTORY OF PRESENT ILLNESS: This is a 55 year old right-handed woman with multiple medical issues including hypertension, hyperlipidemia, anxiety, DVT on Xarelto, bradycardia s/p pacemaker placement, chronic pain syndrome, and stroke in 09/2015 with residual left leg weakness, presenting for new onset seizures. She reports seizures started a month ago, she would start having a headache, then wake up with her whole body weak. Her roommate has told her she is "thrashing her head, out of control thrashing," she would hurt herself, beating herself with her hands and arms, lasting 5-15 minutes. It would take her a while to come out of it, then sometimes she would go into another one. She has had urinary incontinence and has bitten the inside of her cheeks and lips with these, one time she woke up with blood on all over her pillow. Her roommate had noticed that if she had a very stressful day, these would trigger a seizure. She reports having had 10 seizures since July, last was 2 days ago. She is tearful the the staff has been told she is "faking it" and now is scared to leave her room. All the seizures have occurred in bed. There is only one [facility] when she was first admitted for the seizures, however when she [facility] for these episodes, there is note that she has pseudoseizures [facility]. At one point she was on Dilantin, then Keppra. Currently she is taking Topamax 100mg  BID and Lamictal 25mg  BID without side effects. She has also been taking Gabapentin for many years for post-herpetic neuralgia. She states she had an [facility] weeks ago, however we do not have access to those records and will request for review. She did not have an episode during the EEG.  She reports a stroke in May 2017 after Xarelto was stopped for an esophageal manometry study. Per SNF records, she developed left leg weakness and was [facility]. She is unable to do an MRI due to pacemaker, CT head was unremarkable. It was felt that the weakness is not due to any organic. Cause. She was continued on Prozac. She reports that she had left arm and leg weakness, the arm weakness resolved but the left leg continues to be weak and numb. She has been at [facility] for the past 4 months since that stroke. She is in a wheelchair today and states she is slowly starting to walk with her walker. She denies any headaches except prior to the seizures. No dizziness, diplopia, dysarthria, bowel/bladder dysfunction. She denies any olfactory/gustatory hallucinations, deja vu, rising epigastric sensation,myoclonic jerks. She has chronic neck and back pain. She endorses depression and sees a psychiatrist and therapist. She had a normal birth and early development.  There is no history of febrile convulsions, CNS infections such as meningitis/encephalitis, significant traumatic brain injury, neurosurgical procedures, or family history of seizures.  Prior AEDs: Dilantin, Keppra   PAST MEDICAL HISTORY: Past Medical History:  Diagnosis Date  . Anxiety   . Chronic pain   . Depression   . DVT (deep venous thrombosis) (HCC)   . GI bleed   . Hyperlipidemia   . Hypertension   . Obesity   . PE (pulmonary embolism)   . Stroke [facility])     PAST SURGICAL HISTORY: Past Surgical History:   Procedure Laterality Date  . ABDOMINAL HYSTERECTOMY    . ANKLE SURGERY    . APPENDECTOMY    . CHOLECYSTECTOMY    . HERNIA REPAIR    . HIP ARTHROPLASTY    . KIDNEY SURGERY    . KNEE SURGERY      MEDICATIONS: Current Outpatient Prescriptions on File Prior to Visit  Medication Sig Dispense Refill  . atorvastatin (LIPITOR) 10 MG tablet Take 10 mg by mouth daily.    . clonazePAM (KLONOPIN) 0.5 MG tablet Take 0.5 mg by mouth 2 (two) times daily as needed for anxiety.    . docusate sodium (COLACE) 100 MG capsule Take 100 mg by mouth 2 (two) times daily.    Marland Kitchen FLUoxetine (PROZAC) 20 MG tablet Take 60 mg by mouth daily.    Marland Kitchen gabapentin (NEURONTIN) 400 MG capsule Take 400 mg by mouth at bedtime.    Marland Kitchen omeprazole (PRILOSEC) 20 MG capsule Take 1 capsule (20 mg total) by mouth daily. 5 capsule 0  . ondansetron (ZOFRAN) 4 MG tablet Take 4 mg by mouth every 8 (eight) hours as needed for nausea or vomiting.    Marland Kitchen oxybutynin (DITROPAN) 5 MG tablet Take 5 mg by mouth 2 (two) times daily.    . polyethylene glycol (MIRALAX / GLYCOLAX) packet Take 17 g by mouth daily as needed for moderate constipation.    . propranolol (INDERAL) 20 MG tablet Take 20 mg by mouth daily.    . ranitidine (ZANTAC) 150 MG tablet Take 1 tablet (150 mg total) by mouth 2 (two) times daily. 60 tablet 0  . topiramate (TOPAMAX) 100 MG tablet Take 100 mg by mouth 2 (two) times daily.    . traZODone (DESYREL) 50 MG tablet Take 25 mg by mouth at bedtime.     No current facility-administered medications on file prior to visit.     ALLERGIES: Allergies  Allergen Reactions  . Bee Venom Anaphylaxis  . Nsaids Other (See Comments)    Crohns  . Phenergan [Promethazine Hcl] Other (See Comments)    "jittery"   . Toradol [Ketorolac Tromethamine] Other (See Comments)    Can not take due to Crohns    FAMILY HISTORY: No family history on file.  SOCIAL HISTORY: Social History   Social History  . Marital status: Single    Spouse  name: N/A  . Number of children: N/A  . Years of education: N/A   Occupational History  . Not on file.   Social History Main Topics  . Smoking status: Former Smoker    Types: Cigarettes  . Smokeless tobacco: Never Used  . Alcohol use No  . Drug use: No  . Sexual activity: Not on file   Other Topics Concern  . Not on file   Social History Narrative  . No narrative on file    REVIEW OF SYSTEMS: Constitutional: No fevers, chills, or sweats, no generalized fatigue, change in appetite Eyes: No visual changes, double vision, eye pain Ear, nose and throat: No hearing loss, ear pain, nasal congestion, sore throat Cardiovascular: No  chest pain, palpitations Respiratory:  No shortness of breath at rest or with exertion, wheezes GastrointestinaI: No nausea, vomiting, diarrhea, abdominal pain, fecal incontinence Genitourinary:  No dysuria, urinary retention or frequency Musculoskeletal:  + neck pain, back pain Integumentary: No rash, pruritus, skin lesions Neurological: as above Psychiatric: + depression, insomnia, anxiety Endocrine: No palpitations, fatigue, diaphoresis, mood swings, change in appetite, change in weight, increased thirst Hematologic/Lymphatic:  No anemia, purpura, petechiae. Allergic/Immunologic: no itchy/runny eyes, nasal congestion, recent allergic reactions, rashes  PHYSICAL EXAM: Vitals:   01/15/16 0919  BP: 124/78  Pulse: (!) 140  Temp: 98.3 F (36.8 C)   General: No acute distress Head:  Normocephalic/atraumatic Eyes: Fundoscopic exam shows bilateral sharp discs, no vessel changes, exudates, or hemorrhages Neck: supple, no paraspinal tenderness, full range of motion Back: No paraspinal tenderness Heart: regular rate and rhythm Lungs: Clear to auscultation bilaterally. Vascular: No carotid bruits. Skin/Extremities: No rash, no edema Neurological Exam: Mental status: alert and oriented to person, place, and time, no dysarthria or aphasia, Fund of  knowledge is appropriate.  Recent and remote memory are intact.  Attention and concentration are normal.    Able to name objects and repeat phrases. Cranial nerves: CN I: not tested CN II: pupils equal, round and reactive to light, visual fields intact, fundi unremarkable. CN III, IV, VI:  full range of motion, no nystagmus, no ptosis CN V: facial sensation intact CN VII: upper and lower face symmetric CN VIII: hearing intact to finger rub CN IX, X: gag intact, uvula midline CN XI: sternocleidomastoid and trapezius muscles intact CN XII: tongue midline Bulk & Tone: normal, no fasciculations. Motor: 4/5 left hip flexion, knee flexion, 2/5 foot dorsi/plantarflexion and eversion/inversion. Otherwise 5/5 on both UE and right LE. Sensation: intact to light touch, cold, pin, vibration and joint position sense.  No extinction to double simultaneous stimulation. Deep Tendon Reflexes: +1 on right UE and LE, brisk +2 on left UE, +1 left patella, absent ankle jerks bilaterally, no ankle clonus Plantar responses: downgoing bilaterally Cerebellar: no incoordination on finger to nose, heel to shin on right (unable to do on left). No dysdiadochokinesia Gait: not tested, ambulates with walker which she did not bring today Tremor: none  IMPRESSION: This is a 55 year old right-handed woman with a history of hypertension, hyperlipidemia, anxiety, DVT on Xarelto, bradycardia s/p pacemaker placement, chronic pain syndrome, and stroke in 09/2015 with residual left leg weakness presenting with new onset seizures. She continues to have left leg weakness, however there is a question of functional weakness from previous records. Unable to do MRI brain due to pacemaker. 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 Different causes of seizures were discussed with the  patient, particularly PNES. She reports [facility], unknown results, however typical events were not captured. Report will be requested for review. A 48-hour EEG will be ordered to further classify her spells. Continue with Topamax and Lamictal for now, she was advised to discuss doing Cognitive Behavioral Therapy with her therapist for potential PNES. She expressed understanding and will follow-up after the EEG.   Thank you for allowing me to participate in the care of this patient. Please do not hesitate to call for any questions or concerns.   Patrcia DollyKaren Christyana Corwin, M.D.  CC: Dr. Virgina OrganQureshi

## 2016-01-15 NOTE — Patient Instructions (Signed)
1. Schedule 48-hour EEG 2. Records[facility] from [facility] will be requested for review 3. Continue all your medications 4. Continue psychiatry and psychotherapy, discuss doing Cognitive Behavioral Therapy for potential stress seizures 5. Follow-up after EEG

## 2016-01-22 ENCOUNTER — Ambulatory Visit
Admission: RE | Admit: 2016-01-22 | Discharge: 2016-01-22 | Disposition: A | Discharge status: Home / Self Care | Payer: Medicaid Other | Source: Ambulatory Visit | Attending: Gastroenterology | Admitting: Gastroenterology

## 2016-01-22 DIAGNOSIS — K50919 Crohn's disease, unspecified, with unspecified complications: Secondary | ICD-10-CM

## 2016-01-22 MED ORDER — IOPAMIDOL (ISOVUE-300) INJECTION 61%
125.0000 mL | Freq: Once | INTRAVENOUS | Status: AC | PRN
  Administered 2016-01-22: 125 mL via INTRAVENOUS

## 2016-01-29 ENCOUNTER — Other Ambulatory Visit: Payer: Self-pay | Admitting: Gastroenterology

## 2016-01-29 DIAGNOSIS — R9389 Abnormal findings on diagnostic imaging of other specified body structures: Secondary | ICD-10-CM

## 2016-01-29 DIAGNOSIS — R945 Abnormal results of liver function studies: Secondary | ICD-10-CM

## 2016-01-29 DIAGNOSIS — R7989 Other specified abnormal findings of blood chemistry: Secondary | ICD-10-CM

## 2016-02-03 ENCOUNTER — Ambulatory Visit (INDEPENDENT_AMBULATORY_CARE_PROVIDER_SITE_OTHER): Payer: Medicaid Other | Admitting: Neurology

## 2016-02-03 DIAGNOSIS — R569 Unspecified convulsions: Secondary | ICD-10-CM

## 2016-02-07 ENCOUNTER — Ambulatory Visit
Admission: RE | Admit: 2016-02-07 | Discharge: 2016-02-07 | Disposition: A | Discharge status: Home / Self Care | Payer: Medicaid Other | Source: Ambulatory Visit | Attending: Gastroenterology | Admitting: Gastroenterology

## 2016-02-07 DIAGNOSIS — R945 Abnormal results of liver function studies: Secondary | ICD-10-CM

## 2016-02-07 DIAGNOSIS — R9389 Abnormal findings on diagnostic imaging of other specified body structures: Secondary | ICD-10-CM

## 2016-02-07 DIAGNOSIS — R7989 Other specified abnormal findings of blood chemistry: Secondary | ICD-10-CM

## 2016-02-22 NOTE — Procedures (Signed)
ELECTROENCEPHALOGRAM REPORT  Dates of Recording: 02/03/2016 to 02/05/2016 (however due to technical difficulties, only 12 hours of readable recording was obtained)  Patient's Name: Jean Castillo MRN: [id number] Date of Birth: 12-16-1960  Referring Provider: Dr. Patrcia DollyKaren Eman Morimoto  Procedure: 48-hour ambulatory EEG  History: This is a 55 year old woman with new onset recurrent episodes where she is described as thrashing her head, out of control thrashing," she would hurt herself, beating herself with her hands and arms, lasting 5-15 minutes.  Medications: Lamictal, Topamax, Neurontin, Trazodone, Prozac, Lipitor, Ditropan, Propranolol, Xarelto, MS Contin  Technical Summary: This is a 48-hour multichannel digital EEG recording measured by the international 10-20 system with electrodes applied with paste and impedances below 5000 ohms performed as portable with EKG monitoring. Due to technical difficulties, there were only 12 hours of readable recording captured.  The digital EEG was referentially recorded, reformatted, and digitally filtered in a variety of bipolar and referential montages for optimal display.    DESCRIPTION OF RECORDING: During maximal wakefulness, the background activity consisted of a symmetric 9 Hz posterior dominant rhythm which was reactive to eye opening. There is occasional focal 4-5 Hz theta slowing seen over the bilateral temporal regions, left greater than right.  There were no epileptiform discharges seen.   During the recording, the patient progresses through wakefulness, drowsiness, and Stage 2 sleep. Similar occasional focal slowing is seen over the left greater than right temporal regions.  Again, there were no epileptiform discharges seen.  Events: There were no push button events.   There were no electrographic seizures seen.  EKG lead was unremarkable.  IMPRESSION: This 48-hour ambulatory EEG study is abnormal due to occasional focal slowing over the bilateral  temporal regions, left greater than right. There were technical difficulties with the study and only 12 hours of readable recording were captured.   CLINICAL CORRELATION of the above findings indicates focal cerebral dysfunction over the bilateral temporal regions suggestive of underlying structural or physiologic abnormality. The absence of epileptiform discharges does not rule out a clinical diagnosis of epilepsy. Typical events were not captured. If further clinical questions remain, inpatient video EEG monitoring may be helpful.   Patrcia DollyKaren Tranika Scholler, M.D.

## 2016-03-12 ENCOUNTER — Telehealth: Payer: Self-pay | Admitting: Neurology

## 2016-03-12 NOTE — Telephone Encounter (Signed)
Patient is at [facility] and they would like a copy of the results of the EEG results faxed to them at [phone] to [facility] or you can call [phone]

## 2016-03-13 NOTE — Telephone Encounter (Signed)
Is this something that can be faxed? Patient does not have a follow-up appointment.

## 2016-03-13 NOTE — Telephone Encounter (Signed)
Yes, pls fax them EEG results and ask how she is doing. Also pls ask if they have a copy of her prior EEG that she said was [facility] and send to us. Thanks

## 2016-03-16 NOTE — Telephone Encounter (Signed)
Spoke with Field se[facility]Kayla [facility]. She states patient is doing better and looking for a place to stay outside of their facility. She has not had any seizure activity. Dorathy DaftKayla is going to look for patient's EEG [facility] to fax to us.

## 2016-03-16 NOTE — Telephone Encounter (Signed)
Great, thanks

## 2016-03-20 ENCOUNTER — Encounter: Payer: Self-pay | Admitting: Neurology

## 2016-03-20 ENCOUNTER — Ambulatory Visit (INDEPENDENT_AMBULATORY_CARE_PROVIDER_SITE_OTHER): Payer: Medicaid Other | Admitting: Neurology

## 2016-03-20 VITALS — BP 124/78 | HR 82 | Wt 245.1 lb

## 2016-03-20 DIAGNOSIS — G44219 Episodic tension-type headache, not intractable: Secondary | ICD-10-CM | POA: Diagnosis not present

## 2016-03-20 DIAGNOSIS — R569 Unspecified convulsions: Secondary | ICD-10-CM

## 2016-03-20 NOTE — Patient Instructions (Signed)
1. Increase Gabapentin to 200mg  in AM, 400mg  in PM 2. Continue all your other medications 3. Wishing you well with your move, establish care with Neurology once you [facility]

## 2016-03-20 NOTE — Progress Notes (Signed)
NEUROLOGY FOLLOW UP OFFICE NOTE  Jean Castillo Conradt [id number]  HISTORY OF PRESENT ILLNESS: I had the pleasure of seeing Jean Castillo in follow-up in the neurology clinic on 03/20/2016.  The patient was last seen 2 months ago for recurrent shaking episodes. She is accompanied by SNF staff today.  Records and images were personally reviewed where available.  Routine EEG done at [facility] was abnormal due to poor organization of the background indicating mild encephalopathy of nonspecific etiology. She had a 48-hour EEG, however due to technical difficulties, only 12 hours of readable recording was captured, she did not have any typical events. Baseline EEG was abnormal due to occasional focal slowing over the bilateral temporal regions, left greater than right, no epileptiform discharges seen. Since her last visit, she reports having only seizure that evening that lasted 10 minutes, no further seizures since then. Her main complaint today are frontal and bilateral temporal stabbing headaches that occur every other day or so, lasting 15-20 minutes, no associated nausea/vomiting/photo/phonophobia. Pushing on her forehead helps. She has neck pain. She has occasional dizziness independent of the headaches. She reports that her mother passed away 2 weeks ago and she went home then fell in the shower. She hit her head. She does not recall if headaches started after the fall. She continues to report a lot of stress, she can't go back home now that her mother has passed away and her sister took over. She is looking into ALF in [place], with plans for SNF discharge 04/14/16. She continues to work with the psychiatrist and therapist at [facility].  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 She reports seizures started a month ago, she would start having a headache, then wake up with her whole body weak. Her roommate has told her she is "thrashing her head, out of control thrashing," she would hurt herself, beating herself with her hands and arms, lasting 5-15 minutes. It would take her a while to come out of it, then sometimes she would go into another one. She has had urinary incontinence and has bitten the inside of her cheeks and lips with these, one time she woke up with blood on all over her pillow. Her roommate had noticed that if she had a very stressful day, these would trigger a seizure. She reports having had 10 seizures since July, last was 2 days ago. She is tearful the the staff has been told she is "faking it" and now is scared to leave her room. All the seizures have occurred in bed. 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. At one point she was on Dilantin, then Keppra. Currently she is taking Topamax 100mg  BID and Lamictal 25mg  BID without side effects. She has also been taking Gabapentin for many years for post-herpetic neuralgia. She states she had an EEG done at [facility] 2 weeks ago, however we do not have access to those records and will request for review. She did not have an episode during the EEG.  She reports a stroke in May 2017 after Xarelto was stopped for an esophageal manometry study. Per SNF records, she  developed left leg weakness and was given TPA at [facility]. She is unable to do an MRI due to pacemaker, CT head was unremarkable. It was felt that the weakness is not due to any organic. Cause. She was continued on Prozac. She reports that she had left arm and leg weakness, the arm weakness resolved but the left leg continues to be weak and numb. She has  been at [facility] for the past 4 months since that stroke. She is in a wheelchair today and states she is slowly starting to walk with her walker. She denies any headaches except prior to the seizures. No dizziness, diplopia, dysarthria, bowel/bladder dysfunction. She denies any olfactory/gustatory hallucinations, deja vu, rising epigastric sensation,myoclonic jerks. She has chronic neck and back pain. She endorses depression and sees a psychiatrist and therapist. She had a normal birth and early development.  There is no history of febrile convulsions, CNS infections such as meningitis/encephalitis, significant traumatic brain injury, neurosurgical procedures, or family history of seizures.  Prior AEDs: Dilantin, Keppra PAST MEDICAL HISTORY: Past Medical History:  Diagnosis Date  . Anxiety   . Chronic pain   . Depression   . DVT (deep venous thrombosis) (HCC)   . GI bleed   . Hyperlipidemia   . Hypertension   . Obesity   . PE (pulmonary embolism)   . Seizures (HCC)   . Stroke [facility])     MEDICATIONS: Current Outpatient Prescriptions on File Prior to Visit  Medication Sig Dispense Refill  . atorvastatin (LIPITOR) 10 MG tablet Take 10 mg by mouth daily.    . clonazePAM (KLONOPIN) 0.5 MG tablet Take 0.5 mg by mouth 2 (two) times daily as needed for anxiety.    . docusate sodium (COLACE) 100 MG capsule Take 100 mg by mouth 2 (two) times daily.    Marland Kitchen FLUoxetine (PROZAC) 20 MG tablet Take 60 mg by mouth daily.    Marland Kitchen gabapentin (NEURONTIN) 400 MG capsule Take 400 mg by mouth at bedtime.    . hyoscyamine (LEVBID) 0.375 MG 12 hr tablet Take 1 Tab by mouth every 12 hours as needed for Discomfort.    . lamoTRIgine (LAMICTAL) 25 MG tablet Take 25 mg by mouth 2 (two) times daily.    Marland Kitchen omeprazole (PRILOSEC) 20 MG capsule Take 1 capsule (20 mg total) by mouth daily. 5 capsule 0  . ondansetron (ZOFRAN) 4 MG tablet Take 4 mg by mouth every 8 (eight) hours as needed for nausea or vomiting.    Marland Kitchen  oxybutynin (DITROPAN) 5 MG tablet Take 5 mg by mouth 2 (two) times daily.    . polyethylene glycol (MIRALAX / GLYCOLAX) packet Take 17 g by mouth daily as needed for moderate constipation.    . propranolol (INDERAL) 20 MG tablet Take 20 mg by mouth daily.    . ranitidine (ZANTAC) 150 MG tablet Take 1 tablet (150 mg total) by mouth 2 (two) times daily. 60 tablet 0  . Rivaroxaban (XARELTO) 15 MG TABS tablet Take 15 mg by mouth daily.    Marland Kitchen topiramate (TOPAMAX) 100 MG tablet Take 100 mg by mouth 2 (two) times daily.    . traZODone (DESYREL) 50 MG tablet Take 25 mg by mouth at bedtime.     No current facility-administered medications on file prior to visit.     ALLERGIES: Allergies  Allergen Reactions  . Bee Venom Anaphylaxis  . Nsaids Other (See Comments)    Crohns  . Phenergan [Promethazine Hcl] Other (See  Comments)    "jittery"   . Toradol [Ketorolac Tromethamine] Other (See Comments)    Can not take due to Crohns    FAMILY HISTORY: No family history on file.  SOCIAL HISTORY: Social History   Social History  . Marital status: Single    Spouse name: N/A  . Number of children: N/A  . Years of education: N/A   Occupational History  . Not on file.   Social History Main Topics  . Smoking status: Former Smoker    Types: Cigarettes  . Smokeless tobacco: Never Used  . Alcohol use No  . Drug use: No  . Sexual activity: Not on file   Other Topics Concern  . Not on file   Social History Narrative  . No narrative on file    REVIEW OF SYSTEMS: Constitutional: No fevers, chills, or sweats, no generalized fatigue, change in appetite Eyes: No visual changes, double vision, eye pain Ear, nose and throat: No hearing loss, ear pain, nasal congestion, sore throat Cardiovascular: No chest pain, palpitations Respiratory:  No shortness of breath at rest or with exertion, wheezes GastrointestinaI: No nausea, vomiting, diarrhea, abdominal pain, fecal incontinence Genitourinary:  No  dysuria, urinary retention or frequency Musculoskeletal:  No neck pain, back pain Integumentary: No rash, pruritus, skin lesions Neurological: as above Psychiatric: + depression, insomnia, anxiety Endocrine: No palpitations, fatigue, diaphoresis, mood swings, change in appetite, change in weight, increased thirst Hematologic/Lymphatic:  No anemia, purpura, petechiae. Allergic/Immunologic: no itchy/runny eyes, nasal congestion, recent allergic reactions, rashes  PHYSICAL EXAM: Vitals:   03/20/16 1435  BP: 124/78  Pulse: 82   General: No acute distress, sitting in wheelchair Head:  Normocephalic/atraumatic, no temporal or occipital tenderness Neck: supple, no paraspinal tenderness, full range of motion Heart:  Regular rate and rhythm Lungs:  Clear to auscultation bilaterally Back: No paraspinal tenderness Skin/Extremities: No rash, no edema Neurological Exam: alert and oriented to person, place, and time. No aphasia or dysarthria. Fund of knowledge is appropriate.  Recent and remote memory are intact.  Attention and concentration are normal.    Able to name objects and repeat phrases. Cranial nerves: Pupils equal, round, reactive to light.  Fundoscopic exam unremarkable, no papilledema. Extraocular movements intact with no nystagmus. Visual fields full. Facial sensation intact. No facial asymmetry. Tongue, uvula, palate midline.  Motor: Bulk and tone normal, muscle strength 5/5 throughout with no pronator drift.  Sensation to light touch intact.  No extinction to double simultaneous stimulation.  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 Since her last visit, she only had one more shaking spell and none since. Her ambulatory EEG did not capture typical episodes, there were no epileptiform discharges seen. There was occasional focal slowing over the bilateral temporal regions. 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 Her main concern today are headaches, suggestive of tension-type headaches. She will increase gabapentin to 200mg  in AM, 400mg  in PM. She will be moving out of the SNF in  November to [place], and was advised to establish care with Neurology and the importance of continuation of Psychiatry care. She will follow-up on prn basis and knows to call for any questions.  Thank you for allowing me to participate in her care.  Please do not hesitate to call for any questions or concerns.  The duration of this appointment visit was 24 minutes of face-to-face time with the patient.  Greater than 50% of this time was spent in counseling, explanation of diagnosis, planning of further management, and coordination of care.   Patrcia Dolly, M.D.   CC: Dr. Virgina Organ

## 2017-09-15 IMAGING — DX DG ABDOMEN ACUTE W/ 1V CHEST
4 series · 4 of 4 positions shown · non-contrast
Comparison: None

CLINICAL DATA: Hematemesis a couple weeks ago, recurred today,
epigastric and mid sternal chest pain with burning sensation, slight
shortness of breath, LEFT anterior abdominal pain and aching
yesterday and today, hypertension, hiatal hernia, prior cardiac and
renal surgery, pulmonary emboli, former smoker

EXAM:
DG ABDOMEN ACUTE W/ 1V CHEST

[abdomen supine (1 of 2)]
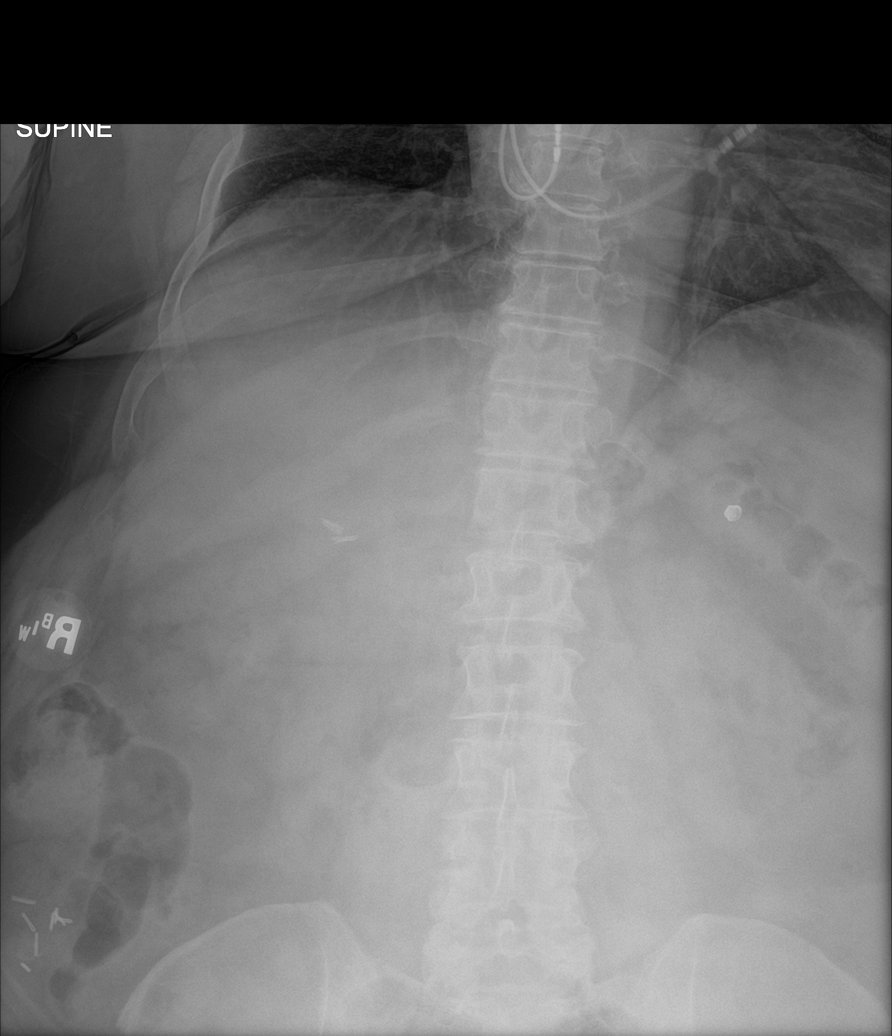

[abdomen supine (2 of 2)]
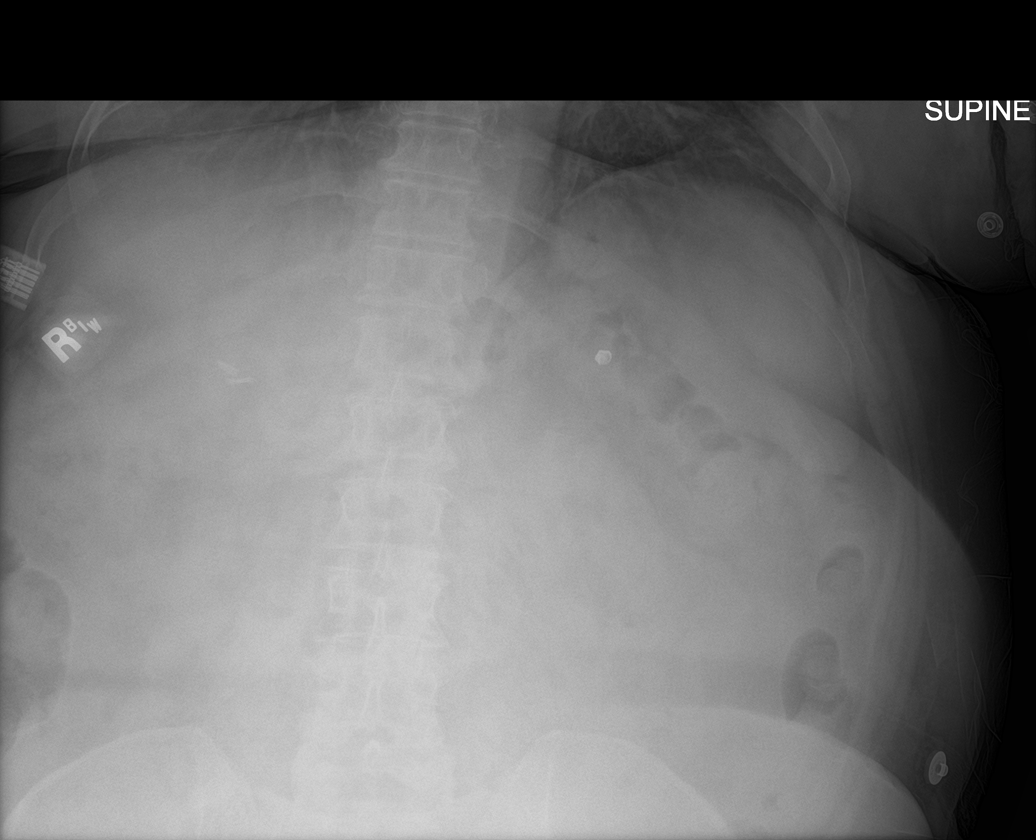

[abdomen decu]
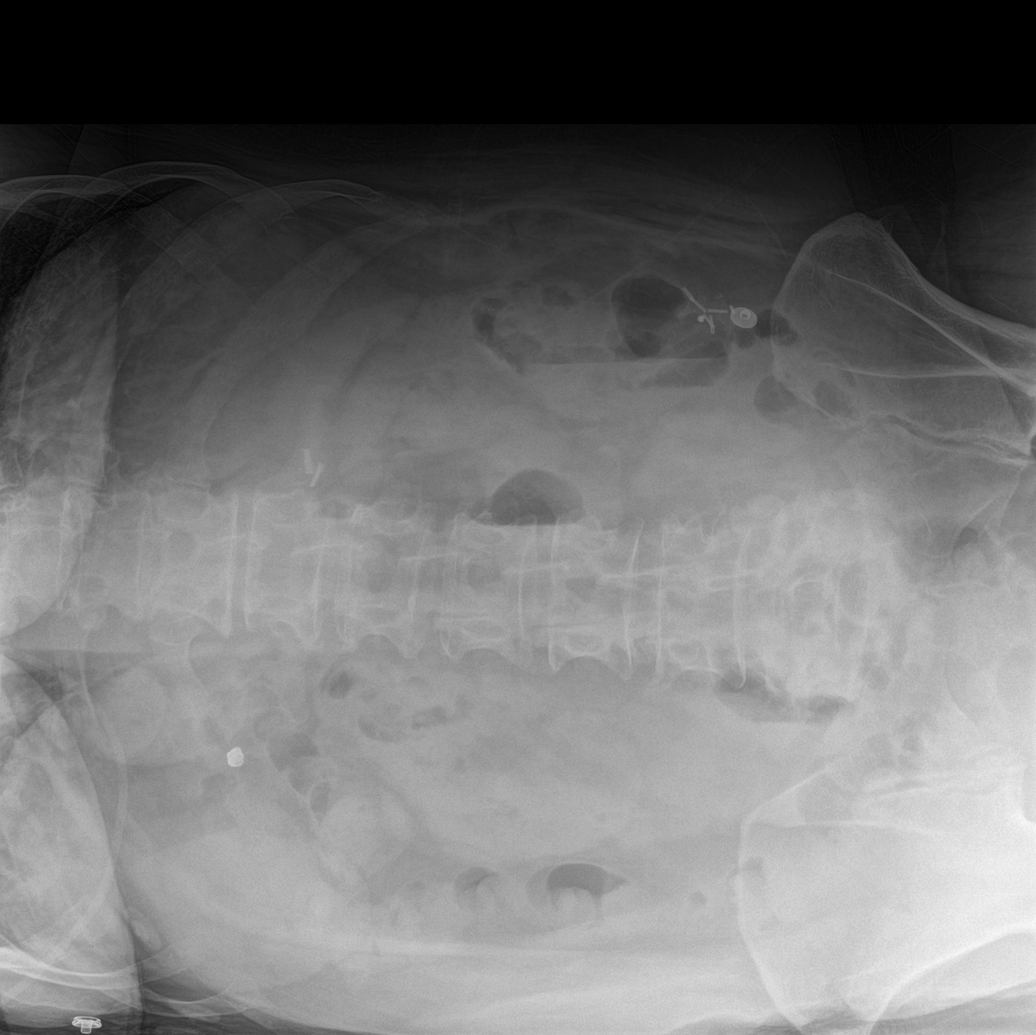

[chest ap]
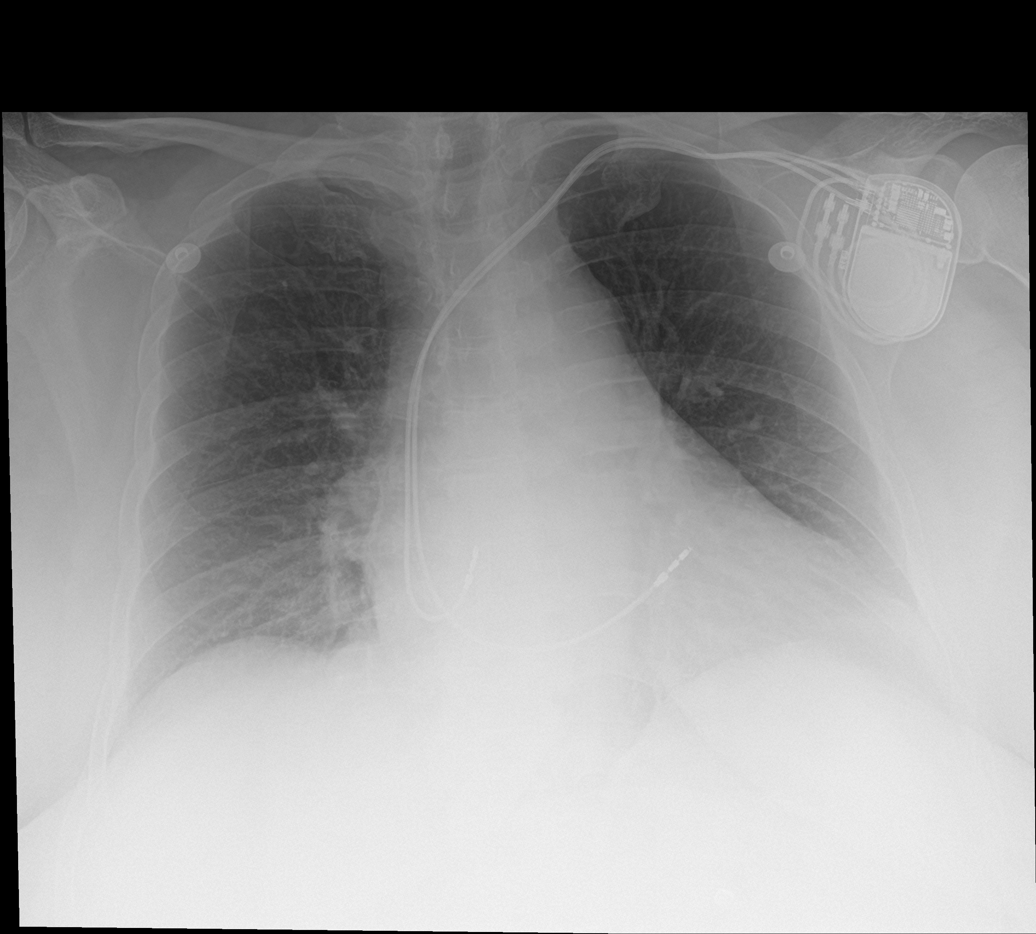

[4 of 4 positions shown; findings below may reference images not displayed]

FINDINGS: LEFT subclavian transvenous pacemaker leads project over RIGHT
atrium and RIGHT ventricle.

Enlargement of cardiac silhouette.

Mediastinal contours and pulmonary vascularity normal.

Lungs clear.

No pleural effusion or pneumothorax.

Surgical clips RIGHT upper quadrant question cholecystectomy.

Additional calcifications RIGHT lower quadrant.

Dense calcification versus radiopaque foreign body projects over
LEFT upper quadrant.

Nonobstructive bowel gas pattern.

No bowel dilatation, bowel wall thickening or free intraperitoneal
air.

Bones demineralized.

No urinary tract calcification.
IMPRESSION: Enlargement of cardiac silhouette post pacemaker.

No acute abdominal findings.

## 2017-12-06 IMAGING — US US ABDOMEN LIMITED
1 series · 14 of 25 positions shown · non-contrast
Comparison: CT scan of the abdomen and pelvis of January 22, 2016

CLINICAL DATA: Abnormal liver function studies. Abnormal surface
contour of the liver demonstrated on CT scan January 22, 2016.
Previous cholecystectomy.

EXAM:
US ABDOMEN LIMITED - RIGHT UPPER QUADRANT

[Series 1: us abdomen limited · 0.22mm/px · 14 of 32 slices shown]
[im 1/32]
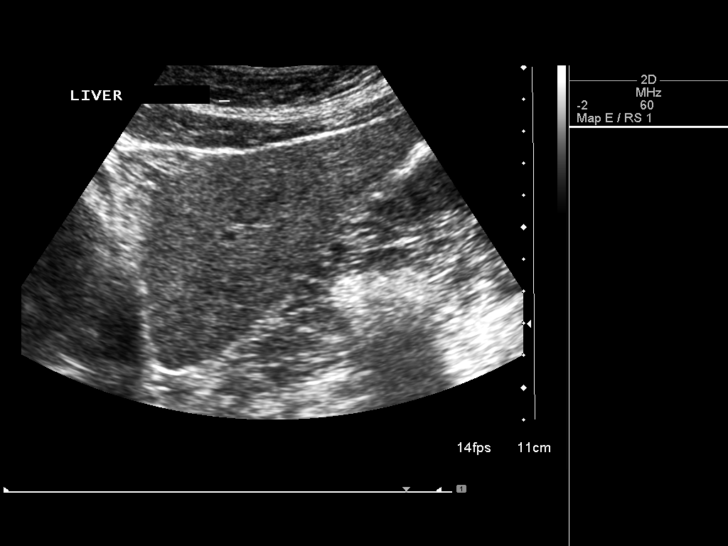
[im 3/32]
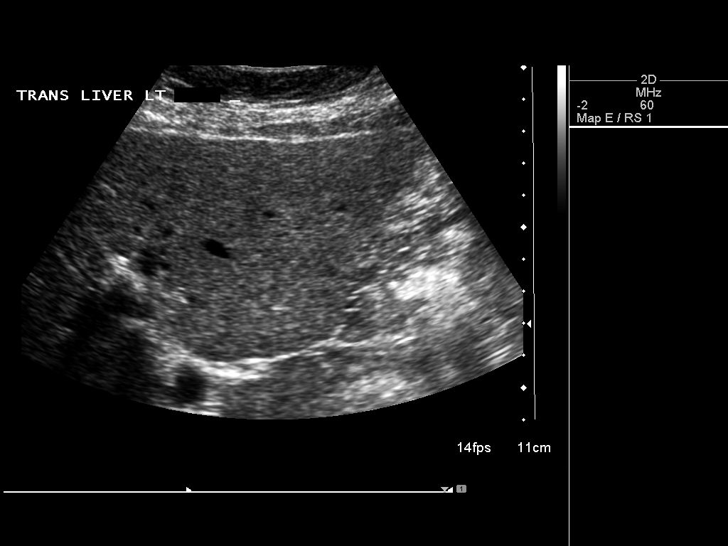
[im 6/32]
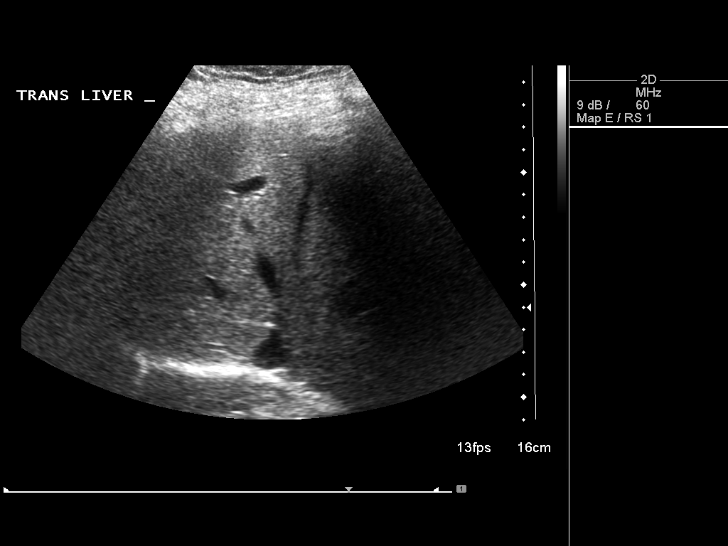
[im 8/32]
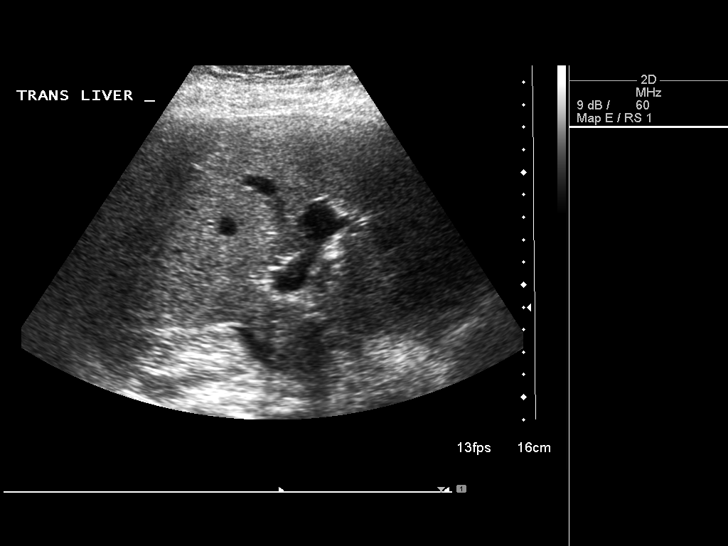
[im 11/32]
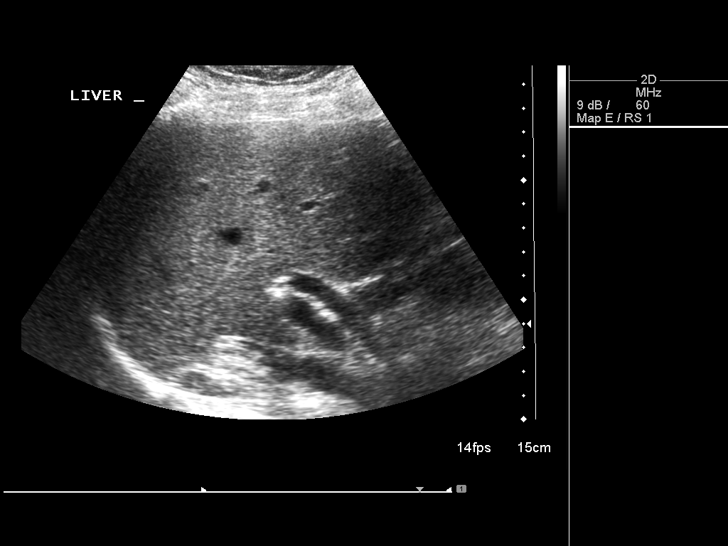
[im 12/32]
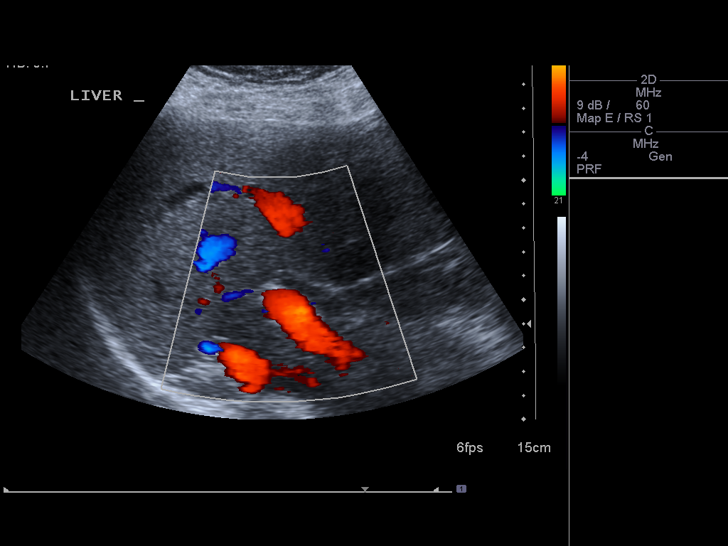
[im 15/32]
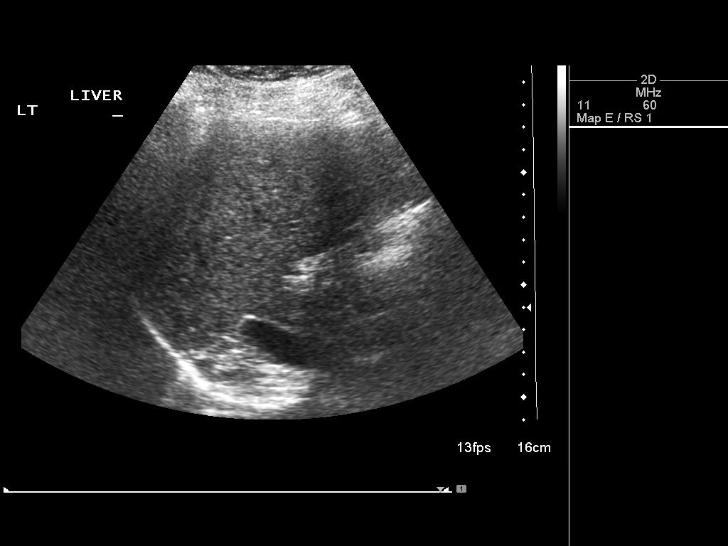
[im 17/32]
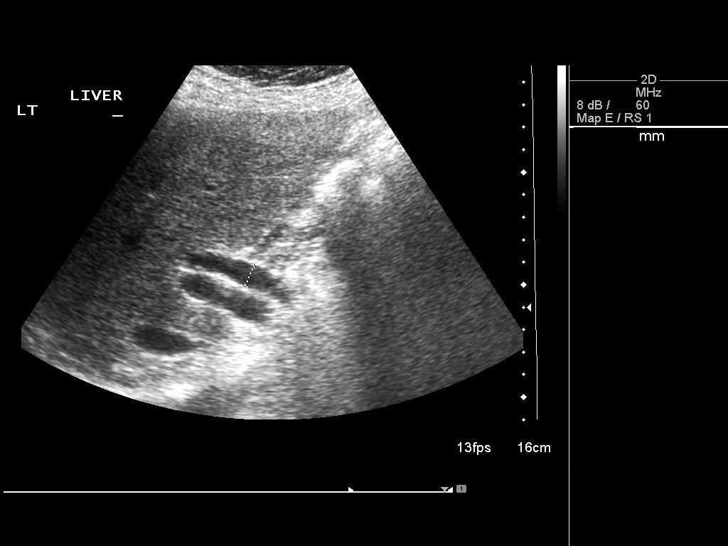
[im 20/32]
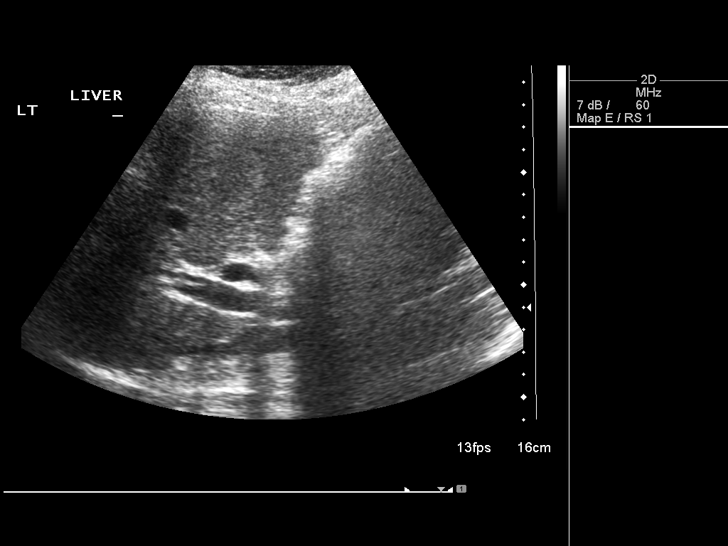
[im 21/32]
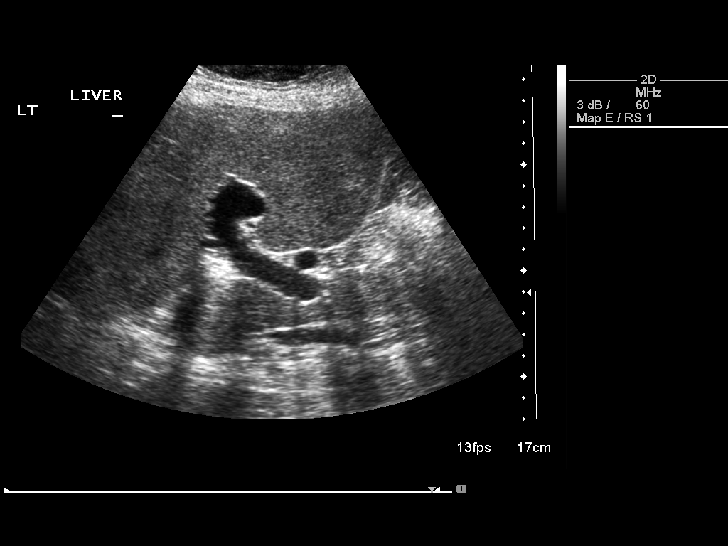
[im 24/32]
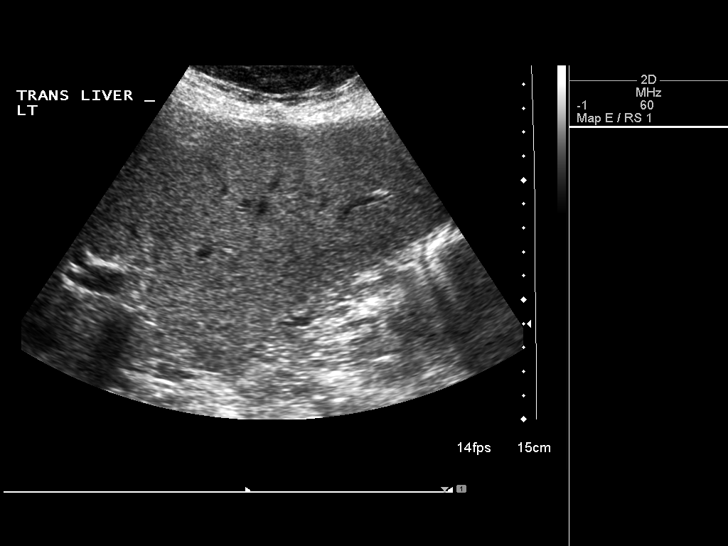
[im 26/32]
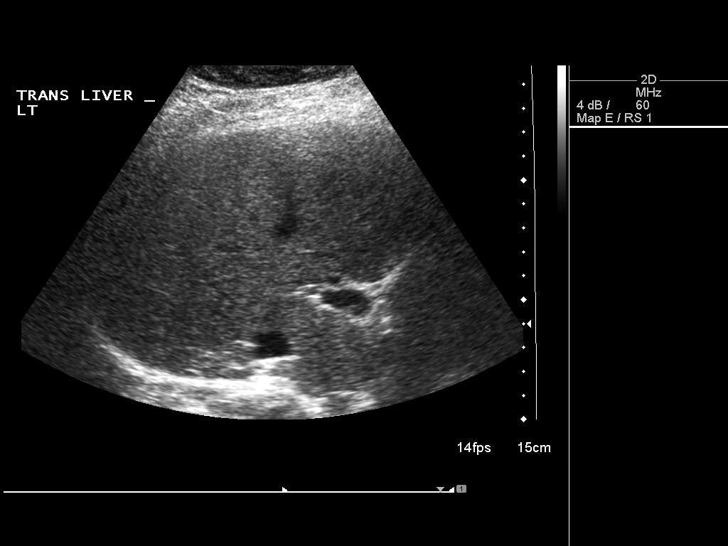
[im 29/32]
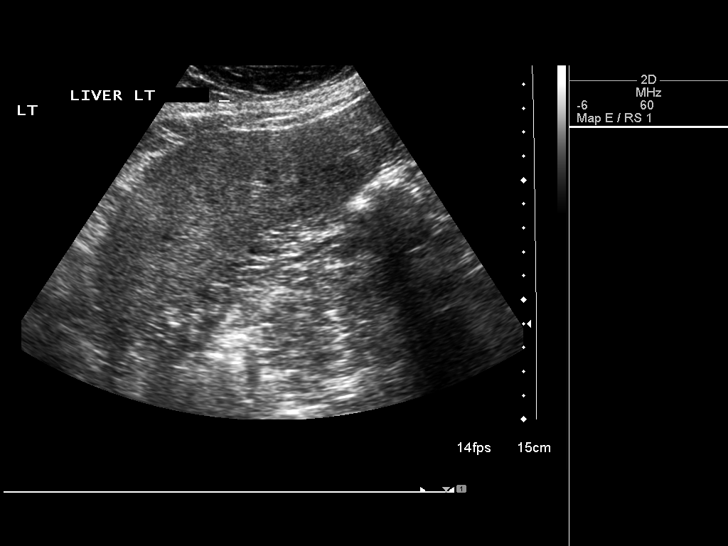
[im 32/32]
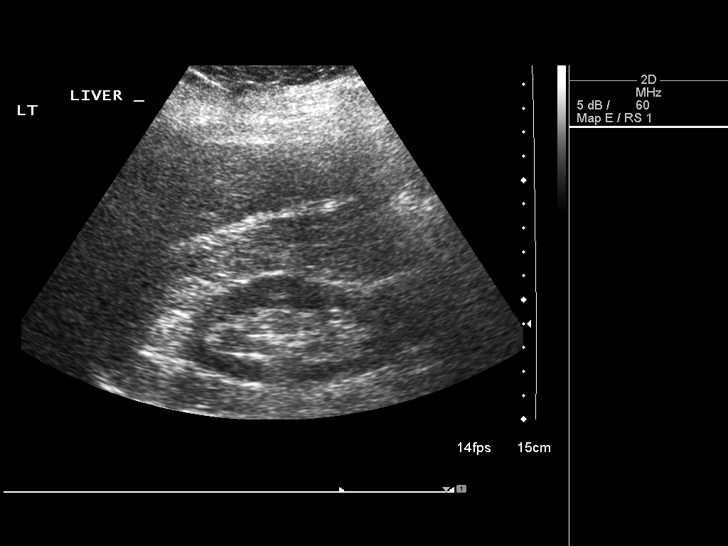

[14 of 25 positions shown; findings below may reference images not displayed]

FINDINGS: Gallbladder:

The gallbladder is surgically absent.

Common bile duct:

Diameter: 10.6 mm.  No intraluminal echoes are observed.

Liver:

The hepatic echotexture appears normal. New there is no mass or
ductal dilation. The surface contour does not appear irregular.
IMPRESSION: 1. No hepatic parenchymal abnormalities are observed. The surface
contour does not appear clearly irregular.
2. Mildly dilated common bile duct appropriate for the post
cholecystectomy state.
